# Patient Record
Sex: Male | Born: 1960
Health system: Southern US, Community
[De-identification: ages and names within clinical notes are randomized; demographics above are authoritative.]

## PROBLEM LIST (undated history)

## (undated) DIAGNOSIS — F1910 Other psychoactive substance abuse, uncomplicated: Secondary | ICD-10-CM

## (undated) DIAGNOSIS — F329 Major depressive disorder, single episode, unspecified: Secondary | ICD-10-CM

## (undated) DIAGNOSIS — G4730 Sleep apnea, unspecified: Secondary | ICD-10-CM

## (undated) DIAGNOSIS — G4733 Obstructive sleep apnea (adult) (pediatric): Secondary | ICD-10-CM

## (undated) DIAGNOSIS — F32A Depression, unspecified: Secondary | ICD-10-CM

## (undated) DIAGNOSIS — I10 Essential (primary) hypertension: Secondary | ICD-10-CM

## (undated) HISTORY — DX: Major depressive disorder, single episode, unspecified: F32.9

## (undated) HISTORY — DX: Other psychoactive substance abuse, uncomplicated: F19.10

## (undated) HISTORY — PX: POLYPECTOMY: SHX149

## (undated) HISTORY — PX: JOINT REPLACEMENT: SHX530

## (undated) HISTORY — DX: Sleep apnea, unspecified: G47.30

## (undated) HISTORY — DX: Depression, unspecified: F32.A

## (undated) HISTORY — PX: ROTATOR CUFF REPAIR: SHX139

## (undated) HISTORY — PX: COLONOSCOPY: SHX174

## (undated) HISTORY — DX: Obstructive sleep apnea (adult) (pediatric): G47.33

---

## 1998-11-02 ENCOUNTER — Encounter: Payer: Self-pay | Admitting: Emergency Medicine

## 1998-11-02 ENCOUNTER — Emergency Department (HOSPITAL_COMMUNITY): Admission: EM | Admit: 1998-11-02 | Discharge: 1998-11-02 | Payer: Self-pay | Admitting: Emergency Medicine

## 2001-11-22 ENCOUNTER — Emergency Department (HOSPITAL_COMMUNITY): Admission: EM | Admit: 2001-11-22 | Discharge: 2001-11-22 | Payer: Self-pay | Admitting: Emergency Medicine

## 2003-04-21 ENCOUNTER — Ambulatory Visit (HOSPITAL_BASED_OUTPATIENT_CLINIC_OR_DEPARTMENT_OTHER): Admission: RE | Admit: 2003-04-21 | Discharge: 2003-04-21 | Payer: Self-pay | Admitting: Orthopaedic Surgery

## 2003-04-21 ENCOUNTER — Ambulatory Visit (HOSPITAL_COMMUNITY): Admission: RE | Admit: 2003-04-21 | Discharge: 2003-04-21 | Payer: Self-pay | Admitting: Orthopaedic Surgery

## 2003-04-21 ENCOUNTER — Encounter (INDEPENDENT_AMBULATORY_CARE_PROVIDER_SITE_OTHER): Payer: Self-pay | Admitting: *Deleted

## 2003-06-06 HISTORY — PX: HAND SURGERY: SHX662

## 2006-06-21 ENCOUNTER — Other Ambulatory Visit: Payer: Self-pay

## 2006-06-21 ENCOUNTER — Emergency Department: Payer: Self-pay | Admitting: Emergency Medicine

## 2007-08-30 ENCOUNTER — Encounter: Admission: RE | Admit: 2007-08-30 | Discharge: 2007-08-30 | Payer: Self-pay | Admitting: Internal Medicine

## 2008-03-05 ENCOUNTER — Ambulatory Visit: Payer: Self-pay | Admitting: Pulmonary Disease

## 2008-03-05 DIAGNOSIS — G4710 Hypersomnia, unspecified: Secondary | ICD-10-CM

## 2008-03-05 DIAGNOSIS — I10 Essential (primary) hypertension: Secondary | ICD-10-CM | POA: Insufficient documentation

## 2008-03-26 ENCOUNTER — Ambulatory Visit (HOSPITAL_BASED_OUTPATIENT_CLINIC_OR_DEPARTMENT_OTHER): Admission: RE | Admit: 2008-03-26 | Discharge: 2008-03-26 | Payer: Self-pay | Admitting: Pulmonary Disease

## 2008-03-26 ENCOUNTER — Encounter: Payer: Self-pay | Admitting: Pulmonary Disease

## 2008-04-03 ENCOUNTER — Ambulatory Visit: Payer: Self-pay | Admitting: Pulmonary Disease

## 2008-04-07 ENCOUNTER — Encounter: Payer: Self-pay | Admitting: Pulmonary Disease

## 2008-04-08 ENCOUNTER — Telehealth (INDEPENDENT_AMBULATORY_CARE_PROVIDER_SITE_OTHER): Payer: Self-pay | Admitting: *Deleted

## 2008-04-10 ENCOUNTER — Ambulatory Visit: Payer: Self-pay | Admitting: Pulmonary Disease

## 2008-04-10 DIAGNOSIS — G4733 Obstructive sleep apnea (adult) (pediatric): Secondary | ICD-10-CM

## 2008-04-10 DIAGNOSIS — I499 Cardiac arrhythmia, unspecified: Secondary | ICD-10-CM | POA: Insufficient documentation

## 2008-04-10 DIAGNOSIS — Z8669 Personal history of other diseases of the nervous system and sense organs: Secondary | ICD-10-CM | POA: Insufficient documentation

## 2010-10-18 NOTE — Procedures (Signed)
NAMECRISTOFER, Stuart Bruce NO.:  [id number]   MEDICAL RECORD NO.:  [id number]          PATIENT TYPE:  OUT   LOCATION:  SLEEP CENTER                 FACILITY:  [facility]   PHYSICIAN:  Barbaraann Share, MD,FCCPDATE OF BIRTH:  08/29/1962   DATE OF STUDY:  04/07/2008                            NOCTURNAL POLYSOMNOGRAM   REFERRING PHYSICIAN:   LOCATION:  Sleep Lab.   REFERRING PHYSICIAN:  Barbaraann Share, MD, FCCP.   DATE OF THE STUDY:  March 26, 2008.   INDICATION FOR THE STUDY:  Hypersomnia with sleep apnea.   EPWORTH SCORE:  8.   SLEEP ARCHITECTURE:  The patient had a total sleep time of 244 minutes  with no slow-wave sleep and only 19 minutes of REM.  Sleep-onset latency  was normal at 18 minutes, and REM onset was normal at 83 minutes.  Sleep  efficiency was very poor at 61%.   RESPIRATORY DATA:  The patient was found to have 43 apneas and 29  obstructive hypopneas for an apnea-hypopnea index of 18 events per hour.  He was also noted to have 42 respiratory effort related arousals, giving  him a RDI of 28 events per hour.  The events were not positional, but  there was loud to very loud snoring noted throughout.   OXYGEN DATA:  There was O2 desaturation as low as 88% with the patient's  obstructive events.   CARDIAC DATA:  Frequent PVCs were noted throughout.   MOVEMENT/PARASOMNIA:  No significant leg jerks or abnormal behaviors  were seen.   IMPRESSION/RECOMMENDATION:  1. Moderate obstructive sleep apnea/hypopnea syndrome with an apnea-      hypopnea index of 18 events per hour, and a respiratory disturbance      index of 28 events per hour.  There was O2 desaturation as low as      88%.  Treatment for this degree of sleep apnea can include weight      loss alone if applicable, upper airway surgery,      oral appliance, and also CPAP.  Clinical correlation is suggested.  2. Frequent premature ventricular contractions were noted throughout.      Barbaraann Share, MD,FCCP  Diplomate, American Board of Sleep  Medicine  Electronically Signed     KMC/MEDQ  D:  04/07/2008 16:14:50  T:  04/08/2008 16:10:96  Job:  [id number]

## 2010-10-21 NOTE — Op Note (Signed)
NAME:  Stuart Bruce, Stuart Bruce                             ACCOUNT NO.:  [id number]   MEDICAL RECORD NO.:  [id number]                   PATIENT TYPE:  AMB   LOCATION:  DSC                                  FACILITY:  [facility]   PHYSICIAN:  Lubertha Basque. Jerl Santos, M.D.             DATE OF BIRTH:  1960-11-27   DATE OF PROCEDURE:  04/21/2003  DATE OF DISCHARGE:                                 OPERATIVE REPORT   PREOPERATIVE DIAGNOSES:  Right ring finger mass.   POSTOPERATIVE DIAGNOSES:  Right ring finger mass.   OPERATION PERFORMED:  Excision, right ring finger mass.   SURGEON:  Lubertha Basque. Jerl Santos, M.D.   ASSISTANT:  Prince Rome, P.A.   ANESTHESIA:  Bier block, MAC.   INDICATIONS FOR PROCEDURE:  The patient is a 50 year old man with many  months of a painful concerning mass on the dorsal aspect of his right ring  finger.  He wishes to have this excised.  Informed operative consent was  obtained after discussion of possible complications of reaction to  anesthesia, neurovascular injury and recurrence.   DESCRIPTION OF PROCEDURE:  The patient was taken to the operating suite  where Bier block was applied to the level of the forearm.  Some sedation was  also given.  The patient was positioned supine and prepped and draped in the  normal sterile fashion.  After administration of intravenous antibiotics, a  small longitudinal incision was made on the dorsal aspect of his right ring  finger.  Dissection was carried down to a vascular mass which measured about  1 cm in diameter.  This appeared to have a vascular connection proximal and  distal.  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  Both ends  were clamped and a cautery was used to coagulate the vascular supply in both  directions.  The small mass was removed.  This was found to be filled with  blood and was decompressed.  This was then placed in Formalin and sent to  pathology.  The wound was then irrigated followed by  reapproximation of the  skin with vertical mattresses of nylon.  Tourniquet was deflated and the  fingertip became pink and warm immediately.  Adaptic was applied to the  wound followed by dry gauze and a loose Coban wrap.  Estimated blood loss  and intraoperative fluids as well as accurate tourniquet time can be  obtained from anesthesia records.   DISPOSITION:  The patient was taken to the recovery room in stable  condition.  Plans were for the patient to go home the same day and to follow  up in the office in less than a week.  I will contact him by phone tonight.  Lubertha Basque Jerl Santos, M.D.    PGD/MEDQ  D:  04/21/2003  T:  04/21/2003  Job:  [id number]

## 2014-12-10 ENCOUNTER — Ambulatory Visit
Admission: EM | Admit: 2014-12-10 | Discharge: 2014-12-10 | Disposition: A | Discharge status: Home / Self Care | Payer: 59 | Attending: Internal Medicine | Admitting: Internal Medicine

## 2014-12-10 DIAGNOSIS — H109 Unspecified conjunctivitis: Secondary | ICD-10-CM | POA: Diagnosis not present

## 2014-12-10 HISTORY — DX: Essential (primary) hypertension: I10

## 2014-12-10 MED ORDER — KETOTIFEN FUMARATE 0.025 % OP SOLN: 1.0000 [drp] | Freq: Two times a day (BID) | OPHTHALMIC | Status: DC

## 2014-12-10 MED ORDER — ERYTHROMYCIN 2 % EX OINT: TOPICAL_OINTMENT | CUTANEOUS | Status: DC

## 2014-12-10 NOTE — ED Notes (Signed)
Pt states "I woke up today with my eyes crusted together, my left more than right."

## 2014-12-10 NOTE — Discharge Instructions (Signed)
Prescriptions for ketotifen eye drop (anti inflammatory) and erythromycin eye ointment sent to [facility] on [address].  Have eye rechecked if not starting to improve in a few days; anticipate slow improvement over the next week or two.  Conjunctivitis Conjunctivitis is commonly called "pink eye." Conjunctivitis can be caused by bacterial or viral infection, allergies, or injuries. There is usually redness of the lining of the eye, itching, discomfort, and sometimes discharge. There may be deposits of matter along the eyelids. A viral infection usually causes a watery discharge, while a bacterial infection causes a yellowish, thick discharge. Pink eye is very contagious and spreads by direct contact. You may be given antibiotic eyedrops as part of your treatment. Before using your eye medicine, remove all drainage from the eye by washing gently with warm water and cotton balls. Continue to use the medication until you have awakened 2 mornings in a row without discharge from the eye. Do not rub your eye. This increases the irritation and helps spread infection. Use separate towels from other household members. Wash your hands with soap and water before and after touching your eyes. Use cold compresses to reduce pain and sunglasses to relieve irritation from light. Do not wear contact lenses or wear eye makeup until the infection is gone. SEEK MEDICAL CARE IF:   Your symptoms are not better after 3 days of treatment.  You have increased pain or trouble seeing.  The outer eyelids become very red or swollen. Document Released: 06/29/2004 Document Revised: 08/14/2011 Document Reviewed: 05/22/2005 [facility] Patient Information 2015 [place], [place]. This information is not intended to replace advice given to you by your health care provider. Make sure you discuss any questions you have with your health care provider.

## 2014-12-10 NOTE — ED Provider Notes (Signed)
CSN: [id number]     Arrival date & time 12/10/14  1711 History   First MD Initiated Contact with Patient 12/10/14 1738     Chief Complaint  Patient presents with  . Eye Pain   HPI Patient is a 54 year old gentleman who presents with onset of left eye discomfort yesterday evening, particularly the left lower lid laterally feels sore. The eye lids were crusty this a.m., And eyes are little red. No runny or congested nose, no sore throat, not coughing. No injury to the left eye. He does not wear contacts.  Past Medical History  Diagnosis Date  . Hypertension    History reviewed. No pertinent past surgical history. History reviewed. No pertinent family history. History  Substance Use Topics  . Smoking status: Never Smoker   . Smokeless tobacco: Not on file  . Alcohol Use: Yes     Comment: social    Review of Systems  All other systems reviewed and are negative.   Allergies  Review of patient's allergies indicates no known allergies.  Home Medications   Prior to Admission medications   Medication Sig Start Date End Date Taking? Authorizing Provider  buPROPion (WELLBUTRIN SR) 100 MG 12 hr tablet Take 100 mg by mouth 2 (two) times daily.   Yes Historical Provider, MD  doxazosin (CARDURA) 8 MG tablet Take 8 mg by mouth daily.   Yes Historical Provider, MD  fosinopril (MONOPRIL) 10 MG tablet Take 10 mg by mouth daily.   Yes Historical Provider, MD   BP 140/100 mmHg  Pulse 69  Temp(Src) 98.3 F (36.8 C) (Tympanic)  Resp 16  Ht 5\' 7"  (1.702 m)  Wt 215 lb (97.523 kg)  BMI 33.67 kg/m2  SpO2 99% Physical Exam  Constitutional: He is oriented to person, place, and time. No distress.  Alert, nicely groomed  HENT:  Head: Atraumatic.  Eyes:  Conjugate gaze. Left eye is moderately injected, with thickening and puffiness, redness, of the upper and lower lids. This is most pronounced at the lateral aspect of the lower lid. Not actually pointing. No corneal irregularity is detected on  penlight exam. The upper lid is everted, no foreign body is seen. No blepharospasm. No photophobia  Neck: Neck supple.  Cardiovascular: Normal rate.   Pulmonary/Chest: No respiratory distress.  Abdominal: Soft. He exhibits no distension.  Musculoskeletal: Normal range of motion.  Neurological: He is alert and oriented to person, place, and time.  Skin: Skin is warm and dry.  No cyanosis  Nursing note and vitals reviewed.    Visual Acuity  Right Eye Distance: 20/30 Left Eye Distance: 20/25 Bilateral Distance: 20/25     ED Course  Procedures   MDM   1. Conjunctivitis of left eye    differential diagnosis includes allergic, irritant, and infectious sources (viral versus bacterial) Ketotifen eyedrops, and erythromycin eye ointment sent to the pharmacy. Anticipate gradual improvement over the next several days. Recheck or follow up with the eye doctor if this does not occur.    Sherlene Shams, MD 12/10/14 [phone]

## 2014-12-18 ENCOUNTER — Encounter: Payer: Self-pay | Admitting: Nurse Practitioner

## 2014-12-18 ENCOUNTER — Ambulatory Visit (INDEPENDENT_AMBULATORY_CARE_PROVIDER_SITE_OTHER): Payer: 59 | Admitting: Nurse Practitioner

## 2014-12-18 VITALS — BP 142/98 | HR 77 | Temp 97.7°F | Resp 18 | Ht 67.0 in | Wt 220.2 lb

## 2014-12-18 DIAGNOSIS — Z7189 Other specified counseling: Secondary | ICD-10-CM | POA: Diagnosis not present

## 2014-12-18 DIAGNOSIS — G4733 Obstructive sleep apnea (adult) (pediatric): Secondary | ICD-10-CM | POA: Diagnosis not present

## 2014-12-18 DIAGNOSIS — I10 Essential (primary) hypertension: Secondary | ICD-10-CM

## 2014-12-18 DIAGNOSIS — Z7689 Persons encountering health services in other specified circumstances: Secondary | ICD-10-CM

## 2014-12-18 MED ORDER — AMLODIPINE BESYLATE 2.5 MG PO TABS: 2.5000 mg | ORAL_TABLET | Freq: Every day | ORAL | Status: DC

## 2014-12-18 NOTE — Assessment & Plan Note (Signed)
Not on anything for treatment currently. Pt had sleep study done years prior at the sleep disorder clinic. Pt reports he improves when losing weight. Discussed diet and exercise.

## 2014-12-18 NOTE — Assessment & Plan Note (Signed)
Discussed acute and chronic issues. Reviewed health maintenance measures, PFSHx, and immunizations. Obtain records from previous facility.   

## 2014-12-18 NOTE — Patient Instructions (Signed)
Follow up in 1 month for HTN. Bring logs from home or average if you can.   Welcome to [facility]!

## 2014-12-18 NOTE — Progress Notes (Signed)
Pre visit review using our clinic review tool, if applicable. No additional management support is needed unless otherwise documented below in the visit note. 

## 2014-12-18 NOTE — Progress Notes (Signed)
Subjective:    Patient ID: Stuart Bruce, male    DOB: Oct 31, 1960, 54 y.o.   MRN: [id number]  HPI  Stuart Bruce is a 54 yo male establishing care and CC of HTN issues.   1) New pt info:   Immunizations- tdap 2/12  Colonoscopy- None so far, will discuss  Eye exam- 2000  2) Chronic Problems-  Depression- Wellbutrin, would like to wean off   HTN- Cadura and Monopril, been on these for 15-20 years he reports. See Acute.  OSA- No treatment, had sleep study in past, still snoring, props up and finds it helps. Wife denies apneic events.   3) Acute Problems-  HTN- BP is not controlled with both medications. Dropped 35 lbs in past and BP was better. Wants to work on diet and exercise, does not use extra salt. Checks at home. This morning 138/89.   Review of Systems  Constitutional: Negative for fever, chills, diaphoresis and fatigue.  Eyes: Negative for visual disturbance.  Respiratory: Negative for chest tightness, shortness of breath and wheezing.   Cardiovascular: Negative for chest pain, palpitations and leg swelling.  Neurological: Negative for dizziness, weakness and numbness.  Psychiatric/Behavioral: The patient is not nervous/anxious.    Past Medical History  Diagnosis Date  . Hypertension   . Depression   . OSA (obstructive sleep apnea)     History   Social History  . Marital Status: Married    Spouse Name: N/A  . Number of Children: N/A  . Years of Education: N/A   Occupational History  . Not on file.   Social History Main Topics  . Smoking status: Never Smoker   . Smokeless tobacco: Never Used  . Alcohol Use: 0.0 oz/week    0 Standard drinks or equivalent per week     Comment: social  . Drug Use: No  . Sexual Activity:    Partners: Female     Comment: Wife- post-menopausal    Other Topics Concern  . Not on file   Social History Narrative   Works for [employer] in [employer] with wife and two daughters   Pets: 1 dog lives inside    Caffeine- 2 cups of  coffee, no soda/tea    Enjoys playing golf, reading    Past Surgical History  Procedure Laterality Date  . Hand surgery Right 2005    Cyst on ring finger    Family History  Problem Relation Age of Onset  . Cancer Mother     Breast  . Diabetes Father   . Cancer Father     Renal  . Drug abuse Paternal Aunt     No Known Allergies  Current Outpatient Prescriptions on File Prior to Visit  Medication Sig Dispense Refill  . buPROPion (WELLBUTRIN SR) 100 MG 12 hr tablet Take 150 mg by mouth daily.     Marland Kitchen doxazosin (CARDURA) 8 MG tablet Take 8 mg by mouth daily.     No current facility-administered medications on file prior to visit.      Objective:   Physical Exam  Constitutional: He is oriented to person, place, and time. He appears well-developed and well-nourished. No distress.  BP 142/98 mmHg  Pulse 77  Temp(Src) 97.7 F (36.5 C)  Resp 18  Ht 5\' 7"  (1.702 m)  Wt 220 lb 3.2 oz (99.882 kg)  BMI 34.48 kg/m2  SpO2 95%   HENT:  Head: Normocephalic and atraumatic.  Right Ear: External ear normal.  Left  Ear: External ear normal.  Cardiovascular: Normal rate, regular rhythm, normal heart sounds and intact distal pulses.  Exam reveals no gallop and no friction rub.   No murmur heard. Pulmonary/Chest: Effort normal and breath sounds normal. No respiratory distress. He has no wheezes. He has no rales. He exhibits no tenderness.  Abdominal:  Obese  Neurological: He is alert and oriented to person, place, and time.  Skin: Skin is warm and dry. No rash noted. He is not diaphoretic.  Psychiatric: He has a normal mood and affect. His behavior is normal. Judgment and thought content normal.      Assessment & Plan:

## 2014-12-18 NOTE — Assessment & Plan Note (Signed)
BP Readings from Last 3 Encounters:  12/18/14 142/98  12/10/14 140/100  04/10/08 138/84   Added Norvasc 2.5 cm. Continue other medications.

## 2015-01-15 ENCOUNTER — Encounter: Payer: Self-pay | Admitting: Nurse Practitioner

## 2015-01-15 ENCOUNTER — Ambulatory Visit (INDEPENDENT_AMBULATORY_CARE_PROVIDER_SITE_OTHER): Payer: 59 | Admitting: Nurse Practitioner

## 2015-01-15 VITALS — BP 138/82 | HR 82 | Temp 98.3°F | Resp 18 | Ht 67.0 in | Wt 209.4 lb

## 2015-01-15 DIAGNOSIS — I10 Essential (primary) hypertension: Secondary | ICD-10-CM

## 2015-01-15 NOTE — Assessment & Plan Note (Signed)
Pt is down 11 lbs and BP down approx 10 points.   FU in 6 months.   BP Readings from Last 3 Encounters:  01/15/15 138/82  12/18/14 142/98  12/10/14 140/100   Wt Readings from Last 3 Encounters:  01/15/15 209 lb 6.4 oz (94.983 kg)  12/18/14 220 lb 3.2 oz (99.882 kg)  12/10/14 215 lb (97.523 kg)   No changes to medication regimen today.

## 2015-01-15 NOTE — Progress Notes (Signed)
Patient ID: Stuart Bruce, male    DOB: [birth year]  Age: 54 y.o. MRN: [id number]  CC: Follow-up   HPI Stuart Bruce presents for follow up of HTN.   1) Added 2.5 mg of norvasc at last visit. Doing well, no complaints.   Increased exercise and stuck to low glycemic diet handout given at last visit.   History Stuart Bruce has a past medical history of Hypertension; Depression; and OSA (obstructive sleep apnea).   He has past surgical history that includes Hand surgery (Right, 2005).   His family history includes Cancer in his father and mother; Diabetes in his father; Drug abuse in his paternal aunt.He reports that he has never smoked. He has never used smokeless tobacco. He reports that he drinks alcohol. He reports that he does not use illicit drugs.  Outpatient Prescriptions Prior to Visit  Medication Sig Dispense Refill  . amLODipine (NORVASC) 2.5 MG tablet Take 1 tablet (2.5 mg total) by mouth daily. 90 tablet 3  . buPROPion (WELLBUTRIN SR) 100 MG 12 hr tablet Take 150 mg by mouth daily.     Marland Kitchen doxazosin (CARDURA) 8 MG tablet Take 8 mg by mouth daily.    . fosinopril (MONOPRIL) 10 MG tablet Take 10 mg by mouth daily.     No facility-administered medications prior to visit.    ROS Review of Systems  Constitutional: Negative for fever, chills, diaphoresis and fatigue.  Eyes: Negative for visual disturbance.  Respiratory: Negative for chest tightness, shortness of breath and wheezing.   Cardiovascular: Negative for chest pain, palpitations and leg swelling.  Neurological: Negative for dizziness, weakness and numbness.  Psychiatric/Behavioral: The patient is not nervous/anxious.     Objective:  BP 138/82 mmHg  Pulse 82  Temp(Src) 98.3 F (36.8 C)  Resp 18  Ht 5\' 7"  (1.702 m)  Wt 209 lb 6.4 oz (94.983 kg)  BMI 32.79 kg/m2  SpO2 97%  Physical Exam  Constitutional: He is oriented to person, place, and time. He appears well-developed and well-nourished. No distress.  HENT:  Head:  Normocephalic and atraumatic.  Right Ear: External ear normal.  Left Ear: External ear normal.  Eyes: Right eye exhibits no discharge. Left eye exhibits no discharge. No scleral icterus.  Cardiovascular: Normal rate, regular rhythm and normal heart sounds.  Exam reveals no gallop and no friction rub.   No murmur heard. Pulmonary/Chest: Effort normal and breath sounds normal. No respiratory distress. He has no wheezes. He has no rales. He exhibits no tenderness.  Neurological: He is alert and oriented to person, place, and time.  Skin: Skin is warm and dry. No rash noted. He is not diaphoretic.  Psychiatric: He has a normal mood and affect. His behavior is normal. Judgment and thought content normal.   Assessment & Plan:   Stuart Bruce was seen today for follow-up.  Diagnoses and all orders for this visit:  Essential hypertension   I am having Stuart Bruce maintain his doxazosin, buPROPion, fosinopril, and amLODipine.  No orders of the defined types were placed in this encounter.     Follow-up: Return in about 6 months (around 07/18/2015) for HTN and Weight loss.

## 2015-01-15 NOTE — Progress Notes (Signed)
Pre visit review using our clinic review tool, if applicable. No additional management support is needed unless otherwise documented below in the visit note. 

## 2015-01-15 NOTE — Patient Instructions (Signed)
Congratulations on your progress!!   Keep up the good work and see you in 6 mos.

## 2015-05-19 ENCOUNTER — Ambulatory Visit
Admission: RE | Admit: 2015-05-19 | Discharge: 2015-05-19 | Disposition: A | Discharge status: Home / Self Care | Payer: 59 | Source: Ambulatory Visit | Attending: Family Medicine | Admitting: Family Medicine

## 2015-05-19 ENCOUNTER — Ambulatory Visit (INDEPENDENT_AMBULATORY_CARE_PROVIDER_SITE_OTHER): Payer: 59 | Admitting: Family Medicine

## 2015-05-19 ENCOUNTER — Encounter: Payer: Self-pay | Admitting: Family Medicine

## 2015-05-19 ENCOUNTER — Telehealth: Payer: Self-pay | Admitting: Family Medicine

## 2015-05-19 VITALS — BP 110/72 | HR 73 | Temp 98.3°F | Ht 67.0 in

## 2015-05-19 DIAGNOSIS — M79605 Pain in left leg: Secondary | ICD-10-CM | POA: Diagnosis not present

## 2015-05-19 DIAGNOSIS — I83892 Varicose veins of left lower extremities with other complications: Secondary | ICD-10-CM | POA: Insufficient documentation

## 2015-05-19 NOTE — Telephone Encounter (Signed)
Called patient and informed him that the ultrasound did not reveal a DVT. It did show superficial thrombophlebitis. Advised treatment of this is cold compresses, anti-inflammatories, and elevation of his leg. He asked if he could use aspirin as his anti-inflammatory. I advised he could. Advised that if his leg were to increase in size, or if he were to develop any chest pain or shortness of breath he should seek medical attention. He voiced understanding.

## 2015-05-19 NOTE — Progress Notes (Signed)
Pre visit review using our clinic review tool, if applicable. No additional management support is needed unless otherwise documented below in the visit note. 

## 2015-05-19 NOTE — Assessment & Plan Note (Signed)
Patient with new onset lower extremity pain associated with new varicose veins in his left lower extremity. Concern would be for DVT. He has no risk factors. His calves are equal in size. These symptoms would argue against this, though cannot rule out without an ultrasound. We will obtain lower extremity ultrasound to rule out DVT. If no DVT could be superficial thrombophlebitis versus varicose veins. Patient is given return precautions.

## 2015-05-19 NOTE — Patient Instructions (Signed)
Nice to meet you. Please go get the ultrasound of your leg. If you develop any chest pain, shortness of breath or swelling in one leg, increased pain, numbness, weakness, or any new or change in symptoms please seek medical attention.

## 2015-05-19 NOTE — Progress Notes (Signed)
Patient ID: Stuart Bruce, male   DOB: [birth year], 54 y.o.   MRN: [id number]  Stuart Rumps, MD Phone: [phone]  Stuart Bruce is a 54 y.o. male who presents today for same-day visit.  Left leg pain: Patient notes he woke up this morning and his left lateral calf hurt. He noted new varicose veins in the anterior portion of his leg as well with this. Describes the pain as a burning sensation right beneath the skin. He has no overlying erythema. States he has a history of this kind of pain in the past that usually resolves quickly, though is not typically associated with varicose veins. No injury to the leg. No recent surgeries. No recent long trips. Typically walks every hour at work. No history of DVT. Does have a history of varicose veins in the posterior portion of his calf. No chest pain or shortness of breath.  PMH: nonsmoker.   ROS see history of present illness  Objective  Physical Exam Filed Vitals:   05/19/15 1558  BP: 110/72  Pulse: 73  Temp: 98.3 F (36.8 C)    Physical Exam  Constitutional: He is well-developed, well-nourished, and in no distress.  HENT:  Head: Normocephalic and atraumatic.  Musculoskeletal:  Left lateral calf in the mid proximal portion with a small area of tenderness with underlying likely varicose vein, there is no erythema or fluctuance in this area, there are varicose veins on the anterior portion of his tibia at the same level, bilateral calves are 38 cm, there is no posterior calf tenderness, 2+ DP pulses bilaterally, 5 out of 5 strength in plantar flexion and dorsiflexion inversion and eversion, sensation to light touch intact in bilateral lower extremities, feet are warm and well-perfused  Skin: Skin is warm and dry. He is not diaphoretic.     Assessment/Plan: Please see individual problem list.  Lower extremity pain, left Patient with new onset lower extremity pain associated with new varicose veins in his left lower extremity. Concern  would be for DVT. He has no risk factors. His calves are equal in size. These symptoms would argue against this, though cannot rule out without an ultrasound. We will obtain lower extremity ultrasound to rule out DVT. If no DVT could be superficial thrombophlebitis versus varicose veins. Patient is given return precautions.    Orders Placed This Encounter  Procedures  . US Venous Img Lower Unilateral Left    Standing Status: Future     Number of Occurrences: 1     Standing Expiration Date: 07/19/2016    Order Specific Question:  Reason for Exam (SYMPTOM  OR DIAGNOSIS REQUIRED)    Answer:  left calf pain, new vericose veins    Order Specific Question:  Preferred imaging location?    Answer:  [facility]    Order Specific Question:  Call Results- Best Contact Number?    Answer:  cell [phone] hold patient    Dragon voice recognition software was used during the dictation process of this note. If any phrases or words seem inappropriate it is likely secondary to the translation process being inefficient.  Stuart Bruce

## 2015-07-23 ENCOUNTER — Ambulatory Visit: Payer: 59 | Admitting: Nurse Practitioner

## 2015-07-26 ENCOUNTER — Encounter: Payer: Self-pay | Admitting: Nurse Practitioner

## 2015-07-26 ENCOUNTER — Ambulatory Visit (INDEPENDENT_AMBULATORY_CARE_PROVIDER_SITE_OTHER): Payer: 59 | Admitting: Nurse Practitioner

## 2015-07-26 VITALS — BP 120/80 | HR 81 | Temp 98.2°F | Resp 12 | Ht 67.0 in | Wt 192.2 lb

## 2015-07-26 DIAGNOSIS — F329 Major depressive disorder, single episode, unspecified: Secondary | ICD-10-CM | POA: Diagnosis not present

## 2015-07-26 DIAGNOSIS — R634 Abnormal weight loss: Secondary | ICD-10-CM | POA: Diagnosis not present

## 2015-07-26 DIAGNOSIS — I10 Essential (primary) hypertension: Secondary | ICD-10-CM | POA: Insufficient documentation

## 2015-07-26 DIAGNOSIS — F32A Depression, unspecified: Secondary | ICD-10-CM | POA: Insufficient documentation

## 2015-07-26 LAB — CBC WITH DIFFERENTIAL/PLATELET
Basophils Absolute: 0 10*3/uL (ref 0.0–0.1)
Basophils Relative: 0.6 % (ref 0.0–3.0)
EOS ABS: 0.4 10*3/uL (ref 0.0–0.7)
Eosinophils Relative: 8.3 % — ABNORMAL HIGH (ref 0.0–5.0)
HCT: 41.5 % (ref 39.0–52.0)
Hemoglobin: 14 g/dL (ref 13.0–17.0)
LYMPHS ABS: 1.3 10*3/uL (ref 0.7–4.0)
Lymphocytes Relative: 24.8 % (ref 12.0–46.0)
MCHC: 33.8 g/dL (ref 30.0–36.0)
MCV: 97.4 fl (ref 78.0–100.0)
Monocytes Absolute: 0.5 10*3/uL (ref 0.1–1.0)
Monocytes Relative: 9.7 % (ref 3.0–12.0)
NEUTROS ABS: 3 10*3/uL (ref 1.4–7.7)
Neutrophils Relative %: 56.6 % (ref 43.0–77.0)
Platelets: 200 10*3/uL (ref 150.0–400.0)
RBC: 4.26 Mil/uL (ref 4.22–5.81)
RDW: 14.2 % (ref 11.5–15.5)
WBC: 5.4 10*3/uL (ref 4.0–10.5)

## 2015-07-26 LAB — COMPREHENSIVE METABOLIC PANEL
ALT: 105 U/L — AB (ref 0–53)
AST: 67 U/L — ABNORMAL HIGH (ref 0–37)
Albumin: 4.3 g/dL (ref 3.5–5.2)
Alkaline Phosphatase: 28 U/L — ABNORMAL LOW (ref 39–117)
BUN: 16 mg/dL (ref 6–23)
CO2: 25 meq/L (ref 19–32)
CREATININE: 0.72 mg/dL (ref 0.40–1.50)
Calcium: 9.6 mg/dL (ref 8.4–10.5)
Chloride: 101 mEq/L (ref 96–112)
GFR: 121.72 mL/min (ref >60.00)
GLUCOSE: 84 mg/dL (ref 70–99)
Potassium: 4.1 mEq/L (ref 3.5–5.1)
SODIUM: 140 meq/L (ref 135–145)
Total Bilirubin: 0.5 mg/dL (ref 0.2–1.2)
Total Protein: 6.9 g/dL (ref 6.0–8.3)

## 2015-07-26 NOTE — Progress Notes (Signed)
Patient ID: Stuart Bruce, male    DOB: [birth year]  Age: 55 y.o. MRN: [id number]  CC: Follow-up   HPI Stuart Bruce presents for follow up of HTN, WT loss, and Wellbutrin.   1) HTN- wt loss has helped bring this down Flutuating at home, but always under 140/90   2) Pt has lost 17 lbs since August.  All diet, not exercising formally   3) Wellbutrin- Stopped taking Felt fine and stopped taking it  Ran out and never got a refill    History Stuart Bruce has a past medical history of Hypertension; Depression; and OSA (obstructive sleep apnea).   Stuart Bruce has past surgical history that includes Hand surgery (Right, 2005).   His family history includes Cancer in his father and mother; Diabetes in his father; Drug abuse in his paternal aunt.Stuart Bruce reports that Stuart Bruce has never smoked. Stuart Bruce has never used smokeless tobacco. Stuart Bruce reports that Stuart Bruce drinks alcohol. Stuart Bruce reports that Stuart Bruce does not use illicit drugs.  Outpatient Prescriptions Prior to Visit  Medication Sig Dispense Refill  . amLODipine (NORVASC) 2.5 MG tablet Take 1 tablet (2.5 mg total) by mouth daily. 90 tablet 3  . doxazosin (CARDURA) 8 MG tablet Take 8 mg by mouth daily.    . fosinopril (MONOPRIL) 10 MG tablet Take 10 mg by mouth daily.    Marland Kitchen buPROPion (WELLBUTRIN SR) 100 MG 12 hr tablet Take 150 mg by mouth daily. Reported on 07/26/2015     No facility-administered medications prior to visit.    ROS Review of Systems  Constitutional: Negative for fever, chills, diaphoresis and fatigue.  Eyes: Negative for visual disturbance.  Respiratory: Negative for chest tightness, shortness of breath and wheezing.   Cardiovascular: Negative for chest pain, palpitations and leg swelling.  Neurological: Negative for dizziness, weakness and numbness.  Psychiatric/Behavioral: The patient is not nervous/anxious.    Objective:  BP 120/80 mmHg  Pulse 81  Temp(Src) 98.2 F (36.8 C) (Oral)  Resp 12  Ht 5\' 7"  (1.702 m)  Wt 192 lb 3.2 oz (87.181 kg)  BMI 30.10 kg/m2   SpO2 95%  Physical Exam  Constitutional: Stuart Bruce is oriented to person, place, and time. Stuart Bruce appears well-developed and well-nourished. No distress.  HENT:  Head: Normocephalic and atraumatic.  Right Ear: External ear normal.  Left Ear: External ear normal.  Cardiovascular: Normal rate, regular rhythm and normal heart sounds.  Exam reveals no gallop and no friction rub.   No murmur heard. Pulmonary/Chest: Effort normal and breath sounds normal. No respiratory distress. Stuart Bruce has no wheezes. Stuart Bruce has no rales. Stuart Bruce exhibits no tenderness.  Neurological: Stuart Bruce is alert and oriented to person, place, and time.  Skin: Skin is warm and dry. No rash noted. Stuart Bruce is not diaphoretic.  Psychiatric: Stuart Bruce has a normal mood and affect. His behavior is normal. Judgment and thought content normal.   Assessment & Plan:   Stuart Bruce was seen today for follow-up.  Diagnoses and all orders for this visit:  Benign essential HTN -     Comprehensive metabolic panel -     CBC with Differential/Platelet  Loss of weight  Depression   I have discontinued Stuart Bruce's buPROPion. I am also having him maintain his doxazosin, fosinopril, and amLODipine.  No orders of the defined types were placed in this encounter.     Follow-up: Return in about 6 months (around 01/23/2016) for Follow up .

## 2015-07-26 NOTE — Patient Instructions (Signed)
Keep up the good work.  See you in 6 months. 

## 2015-07-26 NOTE — Assessment & Plan Note (Signed)
Stable. Pt has lost 17 lbs since August and 30 lbs overall. He would like to lose 20 more and is motivated.   Wt Readings from Last 3 Encounters:  07/26/15 192 lb 3.2 oz (87.181 kg)  01/15/15 209 lb 6.4 oz (94.983 kg)  12/18/14 220 lb 3.2 oz (99.882 kg)

## 2015-07-26 NOTE — Assessment & Plan Note (Signed)
Stopped Wellbutrin on his own.  Okay to do so Feels fine he reports

## 2015-07-26 NOTE — Assessment & Plan Note (Signed)
BP Readings from Last 3 Encounters:  07/26/15 120/80  05/19/15 110/72  01/15/15 138/82   Stable. Okayed for him to try without Norvasc daily. Wife is a Marine scientist and he checks at home. Knows to start back if 140/90 or higher. Will follow up in 6 months and will check CMET and CBC w. Diff.  Lost 17 lbs. Congratulated him

## 2015-09-13 ENCOUNTER — Other Ambulatory Visit: Payer: Self-pay | Admitting: Nurse Practitioner

## 2015-12-02 ENCOUNTER — Telehealth: Payer: Self-pay | Admitting: Family Medicine

## 2015-12-02 MED ORDER — FOSINOPRIL SODIUM 10 MG PO TABS: 10.0000 mg | ORAL_TABLET | Freq: Every day | ORAL | Status: DC

## 2015-12-02 NOTE — Telephone Encounter (Signed)
Refilled and spoke with patient's wife.

## 2015-12-02 NOTE — Telephone Encounter (Signed)
Pt wife called regarding pt needing a refill for fosinopril (MONOPRIL) 10 MG tablet. Pt is sch to see Dr Caryl Bis on 08/14.   Pharmacy is [facility], [address]  Call pt @ [phone] Thank you!

## 2016-01-12 ENCOUNTER — Other Ambulatory Visit: Payer: Self-pay | Admitting: Nurse Practitioner

## 2016-01-12 NOTE — Telephone Encounter (Signed)
Please determine if he is taking this for his blood pressure or a prostate issue. It is not obvious why he is taking this medication from review of the chart. Thanks.

## 2016-01-12 NOTE — Telephone Encounter (Signed)
Medication refilled 09/13/15. Patient has a future appointment with you on 02/18/16. Please advise?

## 2016-01-13 NOTE — Telephone Encounter (Signed)
Refill sent to pharmacy.   

## 2016-01-13 NOTE — Telephone Encounter (Signed)
[facility]TC[facility]

## 2016-01-13 NOTE — Telephone Encounter (Signed)
Blood pressure is why this pt is taking this medication.

## 2016-01-13 NOTE — Telephone Encounter (Signed)
LVTCB Dr.Sonnenberg wants to know what he is taking the Cardura for? Is it for prostate or blood pressure

## 2016-01-13 NOTE — Telephone Encounter (Signed)
Pt was returning office phone call. He says it's about his refill.

## 2016-01-17 ENCOUNTER — Ambulatory Visit: Payer: 59 | Admitting: Family Medicine

## 2016-01-24 ENCOUNTER — Ambulatory Visit: Payer: 59 | Admitting: Nurse Practitioner

## 2016-02-18 ENCOUNTER — Ambulatory Visit (INDEPENDENT_AMBULATORY_CARE_PROVIDER_SITE_OTHER): Payer: 59 | Admitting: Family Medicine

## 2016-02-18 ENCOUNTER — Encounter: Payer: Self-pay | Admitting: Family Medicine

## 2016-02-18 VITALS — BP 128/86 | HR 86 | Temp 98.4°F | Ht 67.0 in | Wt 193.6 lb

## 2016-02-18 DIAGNOSIS — E669 Obesity, unspecified: Secondary | ICD-10-CM | POA: Diagnosis not present

## 2016-02-18 DIAGNOSIS — I10 Essential (primary) hypertension: Secondary | ICD-10-CM

## 2016-02-18 DIAGNOSIS — R945 Abnormal results of liver function studies: Secondary | ICD-10-CM

## 2016-02-18 DIAGNOSIS — R7989 Other specified abnormal findings of blood chemistry: Secondary | ICD-10-CM

## 2016-02-18 DIAGNOSIS — G4733 Obstructive sleep apnea (adult) (pediatric): Secondary | ICD-10-CM

## 2016-02-18 LAB — COMPREHENSIVE METABOLIC PANEL
ALBUMIN: 4.6 g/dL (ref 3.5–5.2)
ALT: 45 U/L (ref 0–53)
AST: 30 U/L (ref 0–37)
Alkaline Phosphatase: 27 U/L — ABNORMAL LOW (ref 39–117)
BILIRUBIN TOTAL: 0.7 mg/dL (ref 0.2–1.2)
BUN: 14 mg/dL (ref 6–23)
CALCIUM: 9.2 mg/dL (ref 8.4–10.5)
CHLORIDE: 101 meq/L (ref 96–112)
CO2: 29 meq/L (ref 19–32)
CREATININE: 0.84 mg/dL (ref 0.40–1.50)
GFR: 101.66 mL/min (ref >60.00)
Glucose, Bld: 91 mg/dL (ref 70–99)
Potassium: 4.1 mEq/L (ref 3.5–5.1)
SODIUM: 138 meq/L (ref 135–145)
Total Protein: 7.6 g/dL (ref 6.0–8.3)

## 2016-02-18 LAB — LIPID PANEL
CHOL/HDL RATIO: 3
CHOLESTEROL: 204 mg/dL — AB (ref 0–200)
HDL: 75 mg/dL (ref >39.00)
LDL Cholesterol: 113 mg/dL — ABNORMAL HIGH (ref 0–99)
NonHDL: 129.23
TRIGLYCERIDES: 82 mg/dL (ref 0.0–149.0)
VLDL: 16.4 mg/dL (ref 0.0–40.0)

## 2016-02-18 LAB — HEMOGLOBIN A1C: Hgb A1c MFr Bld: 5.4 % (ref 4.6–6.5)

## 2016-02-18 NOTE — Assessment & Plan Note (Signed)
At goal. Continue current medications. Labs per below

## 2016-02-18 NOTE — Assessment & Plan Note (Signed)
Not currently on treatment. Has lost weight and had improvement in symptoms. Asymptomatic at this time. He'll monitor for recurrence of symptoms and if they recur we will repeat a sleep study.

## 2016-02-18 NOTE — Patient Instructions (Signed)
Nice to meet you. Please continue your current blood pressure medications. We will check lab work. We will call you with the results. Please continue to run and exercise. Please decrease your alcohol intake. Do not stop drinking alcohol all at once. Decrease slowly over a period of months. If you develop nausea, vomiting, shakiness, or any new or changing symptoms please seek medical attention.

## 2016-02-18 NOTE — Progress Notes (Signed)
Pre visit review using our clinic review tool, if applicable. No additional management support is needed unless otherwise documented below in the visit note. 

## 2016-02-18 NOTE — Progress Notes (Signed)
  [name], MD Phone: [phone]  Stuart Bruce is a 55 y.o. male who presents today for follow-up.  HYPERTENSION  Disease Monitoring  Home BP Monitoring typically 130s over 80s Chest pain- no    Dyspnea- no Medications  Compliance-  taking fosinopril and Cardura.  Edema- no Has been running for exercise. Try to lose weight.  History of sleep apnea: No recent issues with this. Mild snoring. No apneic episodes. No hypersomnia.  Elevated LFTs: Noted on most recent lab work. Patient never followed up for ultrasound. Occasional right upper quadrant discomfort. Drinks 4-5 alcoholic beverages a night. No Tylenol use.   PMH: nonsmoker.   ROS see history of present illness  Objective  Physical Exam Vitals:   02/18/16 0916  BP: 128/86  Pulse: 86  Temp: 98.4 F (36.9 C)    BP Readings from Last 3 Encounters:  02/18/16 128/86  07/26/15 120/80  05/19/15 110/72   Wt Readings from Last 3 Encounters:  02/18/16 193 lb 9.6 oz (87.8 kg)  07/26/15 192 lb 3.2 oz (87.2 kg)  01/15/15 209 lb 6.4 oz (95 kg)    Physical Exam  Constitutional: No distress.  HENT:  Head: Normocephalic and atraumatic.  Cardiovascular: Normal rate, regular rhythm and normal heart sounds.   Pulmonary/Chest: Effort normal and breath sounds normal.  Abdominal: Soft. Bowel sounds are normal. He exhibits no distension. There is no tenderness. There is no rebound and no guarding.  Musculoskeletal: He exhibits no edema.  Neurological: He is alert. Gait normal.  Skin: Skin is warm and dry. He is not diaphoretic.     Assessment/Plan: Please see individual problem list.  Benign essential HTN At goal. Continue current medications. Labs per below  Obstructive sleep apnea Not currently on treatment. Has lost weight and had improvement in symptoms. Asymptomatic at this time. He'll monitor for recurrence of symptoms and if they recur we will repeat a sleep study.  Elevated LFTs Elevated on prior check. He  does drink a fair amount of alcohol. Counseled on decreasing alcohol use. Advised not to cut out all alcohol at once or decrease suddenly. Decrease slowly over a period of time. Avoid Tylenol. Advised of alcohol withdrawal symptoms. Check LFTs today and likely obtain an ultrasound if still elevated.   Orders Placed This Encounter  Procedures  . Comp Met (CMET)  . Lipid Profile  . HgB A1c    [name], MD [place]

## 2016-02-18 NOTE — Assessment & Plan Note (Signed)
Elevated on prior check. He does drink a fair amount of alcohol. Counseled on decreasing alcohol use. Advised not to cut out all alcohol at once or decrease suddenly. Decrease slowly over a period of time. Avoid Tylenol. Advised of alcohol withdrawal symptoms. Check LFTs today and likely obtain an ultrasound if still elevated.

## 2016-04-25 ENCOUNTER — Other Ambulatory Visit: Payer: Self-pay | Admitting: Family Medicine

## 2016-05-19 ENCOUNTER — Ambulatory Visit: Payer: 59 | Admitting: Family Medicine

## 2016-06-14 DIAGNOSIS — J069 Acute upper respiratory infection, unspecified: Secondary | ICD-10-CM | POA: Diagnosis not present

## 2016-07-26 ENCOUNTER — Encounter: Payer: Self-pay | Admitting: Family Medicine

## 2016-07-26 ENCOUNTER — Ambulatory Visit (INDEPENDENT_AMBULATORY_CARE_PROVIDER_SITE_OTHER): Payer: 59 | Admitting: Family Medicine

## 2016-07-26 VITALS — BP 156/92 | HR 91 | Temp 98.4°F | Wt 202.4 lb

## 2016-07-26 DIAGNOSIS — I10 Essential (primary) hypertension: Secondary | ICD-10-CM | POA: Diagnosis not present

## 2016-07-26 DIAGNOSIS — G4733 Obstructive sleep apnea (adult) (pediatric): Secondary | ICD-10-CM

## 2016-07-26 DIAGNOSIS — K625 Hemorrhage of anus and rectum: Secondary | ICD-10-CM | POA: Diagnosis not present

## 2016-07-26 LAB — COMPREHENSIVE METABOLIC PANEL
ALT: 66 U/L — ABNORMAL HIGH (ref 0–53)
AST: 41 U/L — AB (ref 0–37)
Albumin: 4.8 g/dL (ref 3.5–5.2)
Alkaline Phosphatase: 27 U/L — ABNORMAL LOW (ref 39–117)
BUN: 15 mg/dL (ref 6–23)
CALCIUM: 9.8 mg/dL (ref 8.4–10.5)
CHLORIDE: 105 meq/L (ref 96–112)
CO2: 28 meq/L (ref 19–32)
CREATININE: 0.82 mg/dL (ref 0.40–1.50)
GFR: 104.35 mL/min (ref >60.00)
Glucose, Bld: 97 mg/dL (ref 70–99)
POTASSIUM: 4.2 meq/L (ref 3.5–5.1)
SODIUM: 140 meq/L (ref 135–145)
Total Bilirubin: 0.8 mg/dL (ref 0.2–1.2)
Total Protein: 7.5 g/dL (ref 6.0–8.3)

## 2016-07-26 NOTE — Assessment & Plan Note (Signed)
Asymptomatic. Lost a lot of weight and had improvement in his symptoms. He'll continue to monitor.

## 2016-07-26 NOTE — Assessment & Plan Note (Signed)
Patient reports about once a month having right red blood on the tissue when he wipes after having a bowel movement. Does appear to have skin tags which could've been prior hemorrhoids. He has never had a colonoscopy. Given lack of prior colonoscopy we will refer to GI for evaluation of this bleeding and for colonoscopy.

## 2016-07-26 NOTE — Progress Notes (Signed)
Pre visit review using our clinic review tool, if applicable. No additional management support is needed unless otherwise documented below in the visit note. 

## 2016-07-26 NOTE — Patient Instructions (Signed)
Nice to see you.  We will get you referred to GI for colonoscopy. Please continue to check your blood pressure at home. I would like for you to return in 1 week to have it checked by a nurse and please bring your blood pressure cuff in.

## 2016-07-26 NOTE — Progress Notes (Signed)
  [name], MD Phone: [phone]  Stuart Bruce is a 56 y.o. male who presents today for f/u.  HYPERTENSION  Disease Monitoring  Home BP Monitoring 120-140/80's Chest pain- no    Dyspnea- no Medications  Compliance-  Taking cardura, fosinopril.   Edema- no  OSA: Patient was never on CPAP. Notes no hypersomnia or apneic episodes. Does snore. Notes his symptoms improved significantly after losing about 40 pounds.  Patient notes about once a month he has an issue with some rectal bleeding. Has had hemorrhoids and bleeds a little bit when he wipes his rectum. Only occurs after bowel movements. Notes it is bright red blood. No prior colonoscopy.   PMH: nonsmoker.   ROS see history of present illness  Objective  Physical Exam Vitals:   07/26/16 1334  BP: (!) 156/92  Pulse: 91  Temp: 98.4 F (36.9 C)    BP Readings from Last 3 Encounters:  07/26/16 (!) 156/92  02/18/16 128/86  07/26/15 120/80   Wt Readings from Last 3 Encounters:  07/26/16 202 lb 6.4 oz (91.8 kg)  02/18/16 193 lb 9.6 oz (87.8 kg)  07/26/15 192 lb 3.2 oz (87.2 kg)    Physical Exam  Constitutional: No distress.  HENT:  Head: Normocephalic and atraumatic.  Cardiovascular: Normal rate, regular rhythm and normal heart sounds.   Pulmonary/Chest: Effort normal and breath sounds normal.  Genitourinary:  Genitourinary Comments: Small skin tags noted on the rectum, negative guaiac  Musculoskeletal: He exhibits no edema.  Neurological: He is alert. Gait normal.  Skin: He is not diaphoretic.     Assessment/Plan: Please see individual problem list.  Benign essential HTN Elevated today though seems to be well controlled at home. We will have him return in about a week with his blood pressure monitor and have a nurse check his blood pressure and compare it to his monitor. He will continue current medications.  Obstructive sleep apnea Asymptomatic. Lost a lot of weight and had improvement in his  symptoms. He'll continue to monitor.  BRBPR (bright red blood per rectum) Patient reports about once a month having right red blood on the tissue when he wipes after having a bowel movement. Does appear to have skin tags which could've been prior hemorrhoids. He has never had a colonoscopy. Given lack of prior colonoscopy we will refer to GI for evaluation of this bleeding and for colonoscopy.   Orders Placed This Encounter  Procedures  . Comp Met (CMET)  . Ambulatory referral to Gastroenterology    Referral Priority:   Routine    Referral Type:   Consultation    Referral Reason:   Specialty Services Required    Number of Visits Requested:   [place], MD [place]

## 2016-07-26 NOTE — Assessment & Plan Note (Signed)
Elevated today though seems to be well controlled at home. We will have him return in about a week with his blood pressure monitor and have a nurse check his blood pressure and compare it to his monitor. He will continue current medications.

## 2016-07-28 ENCOUNTER — Other Ambulatory Visit: Payer: Self-pay | Admitting: Family Medicine

## 2016-07-28 DIAGNOSIS — R7989 Other specified abnormal findings of blood chemistry: Secondary | ICD-10-CM

## 2016-07-28 DIAGNOSIS — R945 Abnormal results of liver function studies: Principal | ICD-10-CM

## 2016-08-02 ENCOUNTER — Other Ambulatory Visit: Payer: Self-pay | Admitting: Family Medicine

## 2016-08-02 ENCOUNTER — Encounter: Payer: Self-pay | Admitting: Internal Medicine

## 2016-08-02 ENCOUNTER — Ambulatory Visit (INDEPENDENT_AMBULATORY_CARE_PROVIDER_SITE_OTHER): Payer: 59

## 2016-08-02 VITALS — BP 148/94 | HR 61 | Resp 16

## 2016-08-02 DIAGNOSIS — I10 Essential (primary) hypertension: Secondary | ICD-10-CM

## 2016-08-02 MED ORDER — FOSINOPRIL SODIUM 20 MG PO TABS: 20.0000 mg | ORAL_TABLET | Freq: Every day | ORAL | 3 refills | Status: DC

## 2016-08-02 NOTE — Progress Notes (Signed)
Patient comes in for blood pressure check.   Checked with his home blood pressure machine left arm 154/93.  He states blood pressure at home has been averaging around  135/88.   Per verbal orders from Dr Caryl Bis will increase Fosinopril and re check blood pressure and labs in 1 week .  Informed patient to continue to monitor blood pressure at home.  Patient verbalized an understanding.   Please advise .

## 2016-08-03 NOTE — Progress Notes (Signed)
I have reviewed the above note and agree. BP elevated above goal. Increase fosinopril. Recheck lab work in 1 week.  [name], M.D.

## 2016-08-05 ENCOUNTER — Encounter: Payer: Self-pay | Admitting: Family Medicine

## 2016-08-07 MED ORDER — DOXAZOSIN MESYLATE 8 MG PO TABS: 8.0000 mg | ORAL_TABLET | Freq: Every day | ORAL | 1 refills | Status: DC

## 2016-08-09 ENCOUNTER — Other Ambulatory Visit (INDEPENDENT_AMBULATORY_CARE_PROVIDER_SITE_OTHER): Payer: 59

## 2016-08-09 ENCOUNTER — Encounter: Payer: Self-pay | Admitting: *Deleted

## 2016-08-09 ENCOUNTER — Ambulatory Visit (INDEPENDENT_AMBULATORY_CARE_PROVIDER_SITE_OTHER): Payer: 59 | Admitting: *Deleted

## 2016-08-09 VITALS — BP 154/100 | HR 70

## 2016-08-09 DIAGNOSIS — R7989 Other specified abnormal findings of blood chemistry: Secondary | ICD-10-CM | POA: Diagnosis not present

## 2016-08-09 DIAGNOSIS — I10 Essential (primary) hypertension: Secondary | ICD-10-CM | POA: Diagnosis not present

## 2016-08-09 DIAGNOSIS — R945 Abnormal results of liver function studies: Principal | ICD-10-CM

## 2016-08-09 NOTE — Progress Notes (Signed)
Patient presented for 1 week BP check from increase in Fosinopril to 20 mg daily. BP was attained in left arm after 8 minutes sitting in room  BP  158/102 pulse 85 , attained BP in right arm after five to eight minutes of rest 154/100 pulse 70 02 sat @ 97 . Patient was allowed to rest 15 additional minutes while nurse spoke with PCP and then BP attained again in left arm 148/98 pulse 68.  PCP gave verbal for BP check.upon patient arrival. Also reviewed finding with PCP before letting patient leave.

## 2016-08-10 LAB — HEPATIC FUNCTION PANEL
ALBUMIN: 4.5 g/dL (ref 3.5–5.2)
ALT: 51 U/L (ref 0–53)
AST: 38 U/L — AB (ref 0–37)
Alkaline Phosphatase: 29 U/L — ABNORMAL LOW (ref 39–117)
BILIRUBIN TOTAL: 0.6 mg/dL (ref 0.2–1.2)
Bilirubin, Direct: 0.1 mg/dL (ref 0.0–0.3)
Total Protein: 7.4 g/dL (ref 6.0–8.3)

## 2016-08-10 LAB — COMPREHENSIVE METABOLIC PANEL
ALK PHOS: 29 U/L — AB (ref 39–117)
ALT: 51 U/L (ref 0–53)
AST: 38 U/L — AB (ref 0–37)
Albumin: 4.5 g/dL (ref 3.5–5.2)
BUN: 18 mg/dL (ref 6–23)
CO2: 29 mEq/L (ref 19–32)
CREATININE: 0.84 mg/dL (ref 0.40–1.50)
Calcium: 10.3 mg/dL (ref 8.4–10.5)
Chloride: 104 mEq/L (ref 96–112)
GFR: 101.47 mL/min (ref >60.00)
GLUCOSE: 130 mg/dL — AB (ref 70–99)
POTASSIUM: 4.5 meq/L (ref 3.5–5.1)
SODIUM: 138 meq/L (ref 135–145)
Total Bilirubin: 0.6 mg/dL (ref 0.2–1.2)
Total Protein: 7.4 g/dL (ref 6.0–8.3)

## 2016-08-11 MED ORDER — AMLODIPINE BESYLATE 5 MG PO TABS: 5.0000 mg | ORAL_TABLET | Freq: Every day | ORAL | 3 refills | Status: DC

## 2016-08-11 NOTE — Addendum Note (Signed)
Addended by: Leone Haven on: 08/11/2016 06:57 PM   Modules accepted: Orders

## 2016-08-11 NOTE — Progress Notes (Signed)
Verbal order was given for blood pressure check. Blood pressure remains elevated. We'll send in amlodipine for the patient to start on.  [name], M.D.

## 2016-08-14 NOTE — Progress Notes (Signed)
Patient notified and voiced understanding.

## 2016-08-15 ENCOUNTER — Other Ambulatory Visit: Payer: 59

## 2016-09-11 ENCOUNTER — Ambulatory Visit (AMBULATORY_SURGERY_CENTER): Payer: Self-pay | Admitting: *Deleted

## 2016-09-11 ENCOUNTER — Encounter: Payer: Self-pay | Admitting: Internal Medicine

## 2016-09-11 VITALS — Ht 67.0 in | Wt 207.0 lb

## 2016-09-11 DIAGNOSIS — Z1211 Encounter for screening for malignant neoplasm of colon: Secondary | ICD-10-CM

## 2016-09-11 MED ORDER — NA SULFATE-K SULFATE-MG SULF 17.5-3.13-1.6 GM/177ML PO SOLN: ORAL | 0 refills | Status: DC

## 2016-09-11 NOTE — Progress Notes (Signed)
Patient denies any allergies to eggs or soy. Patient denies any problems with anesthesia/sedation. Patient denies any oxygen use at home and does not take any diet/weight loss medications. EMMI education assisgned to patient on colonoscopy, this was explained and instructions given to patient. 

## 2016-09-25 ENCOUNTER — Encounter: Payer: Self-pay | Admitting: Internal Medicine

## 2016-09-25 ENCOUNTER — Ambulatory Visit (AMBULATORY_SURGERY_CENTER): Payer: 59 | Admitting: Internal Medicine

## 2016-09-25 VITALS — BP 118/84 | HR 76 | Temp 98.0°F | Resp 14 | Ht 67.0 in | Wt 207.0 lb

## 2016-09-25 DIAGNOSIS — K635 Polyp of colon: Secondary | ICD-10-CM

## 2016-09-25 DIAGNOSIS — D123 Benign neoplasm of transverse colon: Secondary | ICD-10-CM | POA: Diagnosis not present

## 2016-09-25 DIAGNOSIS — Z1212 Encounter for screening for malignant neoplasm of rectum: Secondary | ICD-10-CM | POA: Diagnosis not present

## 2016-09-25 DIAGNOSIS — Z1211 Encounter for screening for malignant neoplasm of colon: Secondary | ICD-10-CM

## 2016-09-25 DIAGNOSIS — K621 Rectal polyp: Secondary | ICD-10-CM

## 2016-09-25 DIAGNOSIS — D128 Benign neoplasm of rectum: Secondary | ICD-10-CM

## 2016-09-25 DIAGNOSIS — D129 Benign neoplasm of anus and anal canal: Secondary | ICD-10-CM

## 2016-09-25 MED ORDER — SODIUM CHLORIDE 0.9 % IV SOLN: 500.0000 mL | INTRAVENOUS | Status: DC

## 2016-09-25 NOTE — Progress Notes (Signed)
Report given to PACU, vss 

## 2016-09-25 NOTE — Progress Notes (Signed)
Pt. Reports no change in surgical or medical history since pre-visit 09/11/2016.

## 2016-09-25 NOTE — Progress Notes (Signed)
Called to room to assist during endoscopic procedure.  Patient ID and intended procedure confirmed with present staff. Received instructions for my participation in the procedure from the performing physician.  

## 2016-09-25 NOTE — Op Note (Signed)
[facility] Patient Name: Stuart Bruce Procedure Date: 09/25/2016 2:41 PM MRN: [id number] Endoscopist: Jerene Bears , MD Age: 56 Referring MD:  Date of Birth: [birth year] Gender: Male Account #: [id number] Procedure:                Colonoscopy Indications:              Screening for colorectal malignant neoplasm, This                            is the patient's first colonoscopy Medicines:                Monitored Anesthesia Care Procedure:                Pre-Anesthesia Assessment:                           - Prior to the procedure, a History and Physical                            was performed, and patient medications and                            allergies were reviewed. The patient's tolerance of                            previous anesthesia was also reviewed. The risks                            and benefits of the procedure and the sedation                            options and risks were discussed with the patient.                            All questions were answered, and informed consent                            was obtained. Prior Anticoagulants: The patient has                            taken no previous anticoagulant or antiplatelet                            agents. ASA Grade Assessment: II - A patient with                            mild systemic disease. After reviewing the risks                            and benefits, the patient was deemed in                            satisfactory condition to undergo the procedure.  After obtaining informed consent, the colonoscope                            was passed under direct vision. Throughout the                            procedure, the patient's blood pressure, pulse, and                            oxygen saturations were monitored continuously. The                            Colonoscope was introduced through the anus and                            advanced to the the cecum,  identified by                            appendiceal orifice and ileocecal valve. The                            colonoscopy was performed without difficulty. The                            patient tolerated the procedure well. The quality                            of the bowel preparation was good. The ileocecal                            valve, appendiceal orifice, and rectum were                            photographed. Scope In: 2:58:09 PM Scope Out: 3:14:32 PM Scope Withdrawal Time: 0 hours 14 minutes 12 seconds  Total Procedure Duration: 0 hours 16 minutes 23 seconds  Findings:                 The digital rectal exam was normal.                           A 5 mm polyp was found in the transverse colon. The                            polyp was sessile. The polyp was removed with a                            cold snare. Resection and retrieval were complete.                           A 4 mm polyp was found in the hepatic flexure. The                            polyp was sessile. The polyp was  removed with a                            cold snare. Resection and retrieval were complete.                           A 7 mm polyp was found in the rectum. The polyp was                            semi-pedunculated. The polyp was removed with a hot                            snare. Resection and retrieval were complete.                           Internal hemorrhoids were found during                            retroflexion. The hemorrhoids were medium-sized. Complications:            No immediate complications. Estimated Blood Loss:     Estimated blood loss was minimal. Impression:               - One 5 mm polyp in the transverse colon, removed                            with a cold snare. Resected and retrieved.                           - One 4 mm polyp at the hepatic flexure, removed                            with a cold snare. Resected and retrieved.                           - One 7 mm  polyp in the rectum, removed with a hot                            snare. Resected and retrieved.                           - Internal hemorrhoids. Recommendation:           - Patient has a contact number available for                            emergencies. The signs and symptoms of potential                            delayed complications were discussed with the                            patient. Return to normal activities tomorrow.  Written discharge instructions were provided to the                            patient.                           - Resume previous diet.                           - Continue present medications.                           - No ibuprofen, naproxen, or other non-steroidal                            anti-inflammatory drugs for 3 weeks after polyp                            removal.                           - Await pathology results.                           - Repeat colonoscopy for surveillance based on                            pathology results. Jerene Bears, MD 09/25/2016 3:18:22 PM This report has been signed electronically.

## 2016-09-25 NOTE — Patient Instructions (Signed)
YOU HAD AN ENDOSCOPIC PROCEDURE TODAY AT [facility]:   Refer to the procedure report that was given to you for any specific questions about what was found during the examination.  If the procedure report does not answer your questions, please call your gastroenterologist to clarify.  If you requested that your care partner not be given the details of your procedure findings, then the procedure report has been included in a sealed envelope for you to review at your convenience later.  YOU SHOULD EXPECT: Some feelings of bloating in the abdomen. Passage of more gas than usual.  Walking can help get rid of the air that was put into your GI tract during the procedure and reduce the bloating. If you had a lower endoscopy (such as a colonoscopy or flexible sigmoidoscopy) you may notice spotting of blood in your stool or on the toilet paper. If you underwent a bowel prep for your procedure, you may not have a normal bowel movement for a few days.  Please Note:  You might notice some irritation and congestion in your nose or some drainage.  This is from the oxygen used during your procedure.  There is no need for concern and it should clear up in a day or so.  SYMPTOMS TO REPORT IMMEDIATELY:   Following lower endoscopy (colonoscopy or flexible sigmoidoscopy):  Excessive amounts of blood in the stool  Significant tenderness or worsening of abdominal pains  Swelling of the abdomen that is new, acute  Fever of 100F or higher   For urgent or emergent issues, a gastroenterologist can be reached at any hour by calling [phone].   DIET:  We do recommend a small meal at first, but then you may proceed to your regular diet.  Drink plenty of fluids but you should avoid alcoholic beverages for 24 hours.  ACTIVITY:  You should plan to take it easy for the rest of today and you should NOT DRIVE or use heavy machinery until tomorrow (because of the sedation medicines used during the test).     FOLLOW UP: Our staff will call the number listed on your records the next business day following your procedure to check on you and address any questions or concerns that you may have regarding the information given to you following your procedure. If we do not reach you, we will leave a message.  However, if you are feeling well and you are not experiencing any problems, there is no need to return our call.  We will assume that you have returned to your regular daily activities without incident.  If any biopsies were taken you will be contacted by phone or by letter within the next 1-3 weeks.  Please call us at [phone] if you have not heard about the biopsies in 3 weeks.    SIGNATURES/CONFIDENTIALITY: You and/or your care partner have signed paperwork which will be entered into your electronic medical record.  These signatures attest to the fact that that the information above on your After Visit Summary has been reviewed and is understood.  Full responsibility of the confidentiality of this discharge information lies with you and/or your care-partner.  No NSAIDS for three weeks due to the cautery use.   Read all of the handouts the recovery room nurse gave you.  You may take your aspirin for your heart.

## 2016-09-26 ENCOUNTER — Telehealth: Payer: Self-pay

## 2016-09-26 NOTE — Telephone Encounter (Signed)
  Follow up Call-  Call back number 09/25/2016  Post procedure Call Back phone  # [phone]  Permission to leave phone message No  Some recent data might be hidden     Patient questions:  Do you have a fever, pain , or abdominal swelling? No. Pain Score  0 *  Have you tolerated food without any problems? Yes.    Have you been able to return to your normal activities? Yes.    Do you have any questions about your discharge instructions: Diet   No. Medications  No. Follow up visit  No.  Do you have questions or concerns about your Care? No.  Actions: * If pain score is 4 or above: No action needed, pain <4.

## 2016-09-29 ENCOUNTER — Encounter: Payer: Self-pay | Admitting: Internal Medicine

## 2016-11-14 ENCOUNTER — Ambulatory Visit: Payer: 59 | Admitting: Family Medicine

## 2016-11-21 ENCOUNTER — Ambulatory Visit: Payer: 59 | Admitting: Family Medicine

## 2017-01-05 ENCOUNTER — Ambulatory Visit: Payer: 59 | Admitting: Family Medicine

## 2017-01-23 ENCOUNTER — Encounter: Payer: Self-pay | Admitting: Family Medicine

## 2017-01-23 ENCOUNTER — Ambulatory Visit (INDEPENDENT_AMBULATORY_CARE_PROVIDER_SITE_OTHER): Payer: 59 | Admitting: Family Medicine

## 2017-01-23 VITALS — BP 140/92 | HR 83 | Temp 98.1°F | Ht 67.0 in | Wt 204.6 lb

## 2017-01-23 DIAGNOSIS — I10 Essential (primary) hypertension: Secondary | ICD-10-CM

## 2017-01-23 DIAGNOSIS — G4733 Obstructive sleep apnea (adult) (pediatric): Secondary | ICD-10-CM

## 2017-01-23 DIAGNOSIS — K625 Hemorrhage of anus and rectum: Secondary | ICD-10-CM | POA: Diagnosis not present

## 2017-01-23 MED ORDER — AMLODIPINE BESYLATE 10 MG PO TABS: 10.0000 mg | ORAL_TABLET | Freq: Every day | ORAL | 3 refills | Status: DC

## 2017-01-23 NOTE — Assessment & Plan Note (Signed)
Patient notes no recurrence. Recent colonoscopy with internal hemorrhoids and 3 polyps. Monitor for recurrence.

## 2017-01-23 NOTE — Assessment & Plan Note (Signed)
Asymptomatic. Monitor for any recurrent symptoms. If has recurrent symptoms consider repeat sleep study.

## 2017-01-23 NOTE — Progress Notes (Signed)
  [name], MD Phone: [phone]  Stuart Bruce is a 56 y.o. male who presents today for follow-up.  Hypertension: Typically in the 130s over 80s. Taking amlodipine, Cardura, fosinopril. No chest pain or shortness of breath. Notes rare edema in his lower extremities that goes down with propping his legs up. No orthopnea.  He had colonoscopy through GI for rectal bleeding. He notes they found 3 polyps that were precancerous. They advised follow-up in 5 years. He's not had any recurrent bleeding.  OSA: Notes he was never on CPAP. He lost weight and symptoms improved. He notes no hypersomnia. He does wake well rested.  PMH: nonsmoker.   ROS see history of present illness  Objective  Physical Exam Vitals:   01/23/17 1519 01/23/17 1546  BP: (!) 142/92 (!) 140/92  Pulse: 83   Temp: 98.1 F (36.7 C)   SpO2: 98%     BP Readings from Last 3 Encounters:  01/23/17 (!) 140/92  09/25/16 118/84  08/09/16 (!) 154/100   Wt Readings from Last 3 Encounters:  01/23/17 204 lb 9.6 oz (92.8 kg)  09/25/16 207 lb (93.9 kg)  09/11/16 207 lb (93.9 kg)    Physical Exam  Constitutional: No distress.  Cardiovascular: Normal rate, regular rhythm and normal heart sounds.   Pulmonary/Chest: Effort normal and breath sounds normal.  Musculoskeletal: He exhibits no edema.  Neurological: He is alert. Gait normal.  Skin: Skin is warm and dry. He is not diaphoretic.     Assessment/Plan: Please see individual problem list.  Benign essential HTN Above goal at home and in the office. We'll increase amlodipine. If he has increased swelling with this he'll let us know. Discussed possibility of constipation with this. He'll continue his other medications. We will follow-up in one month for BP check.  Obstructive sleep apnea Asymptomatic. Monitor for any recurrent symptoms. If has recurrent symptoms consider repeat sleep study.  BRBPR (bright red blood per rectum) Patient notes no recurrence.  Recent colonoscopy with internal hemorrhoids and 3 polyps. Monitor for recurrence.   Orders Placed This Encounter  Procedures  . Comp Met (CMET)    Standing Status:   Future    Standing Expiration Date:   01/23/2018  . HgB A1c    Standing Status:   Future    Standing Expiration Date:   01/23/2018  . Lipid panel    Standing Status:   Future    Standing Expiration Date:   01/23/2018    Meds ordered this encounter  Medications  . UNABLE TO FIND    Sig: Med Name:Nugenix 3 capsules daily  . amLODipine (NORVASC) 10 MG tablet    Sig: Take 1 tablet (10 mg total) by mouth daily.    Dispense:  90 tablet    Refill:  [place], MD [place]

## 2017-01-23 NOTE — Assessment & Plan Note (Signed)
Above goal at home and in the office. We'll increase amlodipine. If he has increased swelling with this he'll let us know. Discussed possibility of constipation with this. He'll continue his other medications. We will follow-up in one month for BP check.

## 2017-01-23 NOTE — Patient Instructions (Signed)
Nice to see you. We'll have you increase your amlodipine to 10 mg daily. You may take two 5 mg tablets by mouth daily until you run out of your current prescription. You can then start the 10 mg tablets. We will have you follow-up in one month for recheck of her blood pressure with the nurse.

## 2017-02-26 ENCOUNTER — Other Ambulatory Visit: Payer: Self-pay | Admitting: Family Medicine

## 2017-02-28 ENCOUNTER — Other Ambulatory Visit: Payer: 59

## 2017-03-07 ENCOUNTER — Other Ambulatory Visit (INDEPENDENT_AMBULATORY_CARE_PROVIDER_SITE_OTHER): Payer: 59

## 2017-03-07 ENCOUNTER — Ambulatory Visit (INDEPENDENT_AMBULATORY_CARE_PROVIDER_SITE_OTHER): Payer: 59 | Admitting: *Deleted

## 2017-03-07 VITALS — BP 136/76 | HR 70 | Resp 18

## 2017-03-07 DIAGNOSIS — I10 Essential (primary) hypertension: Secondary | ICD-10-CM

## 2017-03-07 NOTE — Progress Notes (Signed)
Patient presented for one month BP check after increasing amlodipine to 10 mg daily, patient BP taken left arm 136/76 pulse 70 .

## 2017-03-08 LAB — COMPREHENSIVE METABOLIC PANEL
ALT: 56 U/L — AB (ref 0–53)
AST: 35 U/L (ref 0–37)
Albumin: 4.7 g/dL (ref 3.5–5.2)
Alkaline Phosphatase: 25 U/L — ABNORMAL LOW (ref 39–117)
BILIRUBIN TOTAL: 0.9 mg/dL (ref 0.2–1.2)
BUN: 10 mg/dL (ref 6–23)
CO2: 26 meq/L (ref 19–32)
CREATININE: 0.74 mg/dL (ref 0.40–1.50)
Calcium: 9.6 mg/dL (ref 8.4–10.5)
Chloride: 100 mEq/L (ref 96–112)
GFR: 117.2 mL/min (ref >60.00)
GLUCOSE: 94 mg/dL (ref 70–99)
Potassium: 3.8 mEq/L (ref 3.5–5.1)
SODIUM: 137 meq/L (ref 135–145)
Total Protein: 7.5 g/dL (ref 6.0–8.3)

## 2017-03-08 LAB — LIPID PANEL
Cholesterol: 209 mg/dL — ABNORMAL HIGH (ref 0–200)
HDL: 95.7 mg/dL (ref >39.00)
LDL CALC: 98 mg/dL (ref 0–99)
NonHDL: 113.42
Total CHOL/HDL Ratio: 2
Triglycerides: 76 mg/dL (ref 0.0–149.0)
VLDL: 15.2 mg/dL (ref 0.0–40.0)

## 2017-03-08 LAB — HEMOGLOBIN A1C: Hgb A1c MFr Bld: 5.5 % (ref 4.6–6.5)

## 2017-03-10 ENCOUNTER — Other Ambulatory Visit: Payer: Self-pay | Admitting: Family Medicine

## 2017-03-10 DIAGNOSIS — R945 Abnormal results of liver function studies: Principal | ICD-10-CM

## 2017-03-10 DIAGNOSIS — R7989 Other specified abnormal findings of blood chemistry: Secondary | ICD-10-CM

## 2017-03-10 NOTE — Progress Notes (Signed)
Blood pressure improved. Please continue current medication. Follow-up in 3 months.  [name], M.D.

## 2017-03-12 NOTE — Progress Notes (Signed)
Patient notified and voiced understanding.

## 2017-03-19 ENCOUNTER — Ambulatory Visit: Payer: 59

## 2017-03-27 ENCOUNTER — Ambulatory Visit: Payer: 59

## 2017-04-03 DIAGNOSIS — Z23 Encounter for immunization: Secondary | ICD-10-CM | POA: Diagnosis not present

## 2017-04-19 ENCOUNTER — Ambulatory Visit
Admission: RE | Admit: 2017-04-19 | Discharge: 2017-04-19 | Disposition: A | Discharge status: Home / Self Care | Payer: 59 | Source: Ambulatory Visit | Attending: Family Medicine | Admitting: Family Medicine

## 2017-04-19 DIAGNOSIS — R7989 Other specified abnormal findings of blood chemistry: Secondary | ICD-10-CM | POA: Insufficient documentation

## 2017-04-19 DIAGNOSIS — R932 Abnormal findings on diagnostic imaging of liver and biliary tract: Secondary | ICD-10-CM | POA: Insufficient documentation

## 2017-04-19 DIAGNOSIS — R945 Abnormal results of liver function studies: Secondary | ICD-10-CM

## 2017-07-01 ENCOUNTER — Other Ambulatory Visit: Payer: Self-pay | Admitting: Family Medicine

## 2017-08-21 ENCOUNTER — Other Ambulatory Visit: Payer: Self-pay | Admitting: Family Medicine

## 2017-11-15 ENCOUNTER — Other Ambulatory Visit: Payer: Self-pay | Admitting: Family Medicine

## 2018-02-06 ENCOUNTER — Other Ambulatory Visit: Payer: Self-pay | Admitting: Family Medicine

## 2018-03-09 ENCOUNTER — Other Ambulatory Visit: Payer: Self-pay | Admitting: Family Medicine

## 2018-03-12 NOTE — Telephone Encounter (Signed)
Patient needs follow-up to receive a refill. Please call the patient to arrange follow-up. He could have labs drawn for a BMET prior to his appointment and receive a short term refill if he agrees to labs. Thanks.

## 2018-03-12 NOTE — Telephone Encounter (Signed)
Last OV8/18 please advise to refill.

## 2018-03-19 ENCOUNTER — Telehealth: Payer: Self-pay

## 2018-03-19 NOTE — Telephone Encounter (Signed)
Fax from [facility] request for FOSINOPRIL 20 MG   Last OV 01/23/2017   Pt needs an OV for more refilled. Call pt to schedule for an appt.

## 2018-03-21 NOTE — Telephone Encounter (Signed)
Last OV 03/10/2017   Last refilled 08/23/2017 disp 90 with no refills   Sent to PCP to advise

## 2018-03-22 NOTE — Telephone Encounter (Signed)
Patient needs an office visit for further refills. It has been over a year since he was last seen. Please get him scheduled for an appointment. Thanks.

## 2018-03-26 NOTE — Telephone Encounter (Signed)
Called pt and left a detailed VM explaining to pt that he will need a OV with Caryl Bis since it has been a year since last seen in order to get refills for their prescription Fosinopril 20 MG.   Did ask patient to call us back to get schedule for an appt.   CRM created and sent to [facility] pool.

## 2018-05-07 ENCOUNTER — Other Ambulatory Visit: Payer: Self-pay | Admitting: Family Medicine

## 2018-05-08 ENCOUNTER — Other Ambulatory Visit: Payer: Self-pay | Admitting: Family Medicine

## 2018-05-10 NOTE — Telephone Encounter (Signed)
Detailed message left for patient to return call to office.

## 2018-05-24 NOTE — Telephone Encounter (Signed)
Left message to return call to office.

## 2018-08-06 ENCOUNTER — Other Ambulatory Visit: Payer: Self-pay | Admitting: Family Medicine

## 2018-08-28 ENCOUNTER — Telehealth: Payer: Self-pay | Admitting: Family Medicine

## 2018-08-28 ENCOUNTER — Telehealth: Payer: Self-pay

## 2018-08-28 ENCOUNTER — Other Ambulatory Visit: Payer: Self-pay

## 2018-08-28 ENCOUNTER — Telehealth (INDEPENDENT_AMBULATORY_CARE_PROVIDER_SITE_OTHER): Payer: BLUE CROSS/BLUE SHIELD | Admitting: Family Medicine

## 2018-08-28 DIAGNOSIS — J989 Respiratory disorder, unspecified: Secondary | ICD-10-CM | POA: Insufficient documentation

## 2018-08-28 DIAGNOSIS — I10 Essential (primary) hypertension: Secondary | ICD-10-CM

## 2018-08-28 MED ORDER — AMLODIPINE BESYLATE 10 MG PO TABS: 5.0000 mg | ORAL_TABLET | Freq: Every day | ORAL | 1 refills | Status: DC

## 2018-08-28 MED ORDER — FOSINOPRIL SODIUM 10 MG PO TABS: ORAL_TABLET | ORAL | 1 refills | Status: DC

## 2018-08-28 NOTE — Progress Notes (Signed)
Virtual Visit via Video Note  I connected with Tawny Asal on 08/28/18 at  3:30 PM EDT by a video enabled telemedicine application and verified that I am speaking with the correct person using two identifiers.  Location patient: home Location provider:work Persons participating in the virtual visit: patient, provider  I discussed the limitations of evaluation and management by telemedicine and the availability of in person appointments. The patient expressed understanding and agreed to proceed.   HPI: HYPERTENSION  Disease Monitoring  Home BP Monitoring 130s-150/80's-90s Chest pain- no    Dyspnea- no Medications  Compliance-  Taking amlodipine and doxazosin. Patient was on fosinopril previously. He has been out of this for several months. Edema- yes, bilateral lower extremities, goes down over night, no orthopnea, no PND  Respiratory illness: patient notes onset of symptoms 3/18 with sore throat. This lasted until 3/22 then he developed cough and rhinorrhea. Notes these symptoms have been improving. No fever. No dyspnea. He did travel to [place] prior to becoming ill. He notes no sick contacts.     ROS: See pertinent positives and negatives per HPI.  Past Medical History:  Diagnosis Date  . Depression   . Hypertension   . OSA (obstructive sleep apnea)    no CPAP use    Past Surgical History:  Procedure Laterality Date  . HAND SURGERY Right 2005   Cyst on ring finger    Family History  Problem Relation Age of Onset  . Cancer Mother        Breast  . Diabetes Father   . Cancer Father        Renal  . Drug abuse Paternal Aunt   . Colon cancer Neg Hx     SOCIAL HX: nonsmoker   Current Outpatient Medications:  .  amLODipine (NORVASC) 10 MG tablet, Take 0.5 tablets (5 mg total) by mouth daily., Disp: 45 tablet, Rfl: 1 .  Ascorbic Acid (VITAMIN C) 1000 MG tablet, Take 1,000 mg by mouth daily., Disp: , Rfl:  .  B Complex Vitamins (B COMPLEX 100 PO), Take 1 capsule by  mouth daily., Disp: , Rfl:  .  Coenzyme Q10 (CO Q-10) 200 MG CAPS, Take 1 capsule by mouth daily., Disp: , Rfl:  .  doxazosin (CARDURA) 8 MG tablet, TAKE 1 TABLET(8 MG) BY MOUTH DAILY, Disp: 90 tablet, Rfl: 0 .  fosinopril (MONOPRIL) 10 MG tablet, TAKE 1 TABLET(10 MG) BY MOUTH DAILY, Disp: 90 tablet, Rfl: 1 .  Maca Root (MACA PO), Take 525 mg by mouth daily., Disp: , Rfl:  .  Milk Thistle 300 MG CAPS, Take 1 capsule by mouth daily., Disp: , Rfl:  .  UNABLE TO FIND, Med Name:Nugenix 3 capsules daily, Disp: , Rfl:   Current Facility-Administered Medications:  .  0.9 %  sodium chloride infusion, 500 mL, Intravenous, Continuous, Pyrtle, Lajuan Lines, MD  EXAM:  VITALS per patient if applicable:   GENERAL: alert, oriented, appears well and in no acute distress  HEENT: atraumatic, conjunttiva clear, no obvious abnormalities on inspection of external nose and ears  NECK: normal movements of the head and neck  LUNGS: on inspection no signs of respiratory distress, breathing rate appears normal, no obvious gross SOB, gasping or wheezing  CV: no obvious cyanosis  MS: moves all visible extremities without noticeable abnormality  PSYCH/NEURO: pleasant and cooperative, no obvious depression or anxiety, speech and thought processing grossly intact  ASSESSMENT AND PLAN:  Discussed the following assessment and plan:  Essential hypertension - Plan:  Basic Metabolic Panel (BMET)  Benign essential HTN  Respiratory illness  Benign essential HTN Slightly uncontrolled. We will decrease his amlodipine dose to 5 mg daily given his swelling and add back fosinopril. He previously tolerated the fosinopril well. He will need lab work in 14 days and I will have the [facility] contact him to set this up. Discussed the importance of this.   Respiratory illness Patient with improvement in respiratory illness after travel to [place]. He is well appearing and in no distress. Discussed given his travel and his  symptoms that he should self isolate for 14 days or until 3 consecutive days without fever and improvement in symptoms, and at least 7 days have passed since symptoms first appeared. He verbalized understanding. Discussed calling for any worsening symptoms and discussed return precautions.      I discussed the assessment and treatment plan with the patient. The patient was provided an opportunity to ask questions and all were answered. The patient agreed with the plan and demonstrated an understanding of the instructions.   The patient was advised to call back or seek an in-person evaluation if the symptoms worsen or if the condition fails to improve as anticipated.  I provided 15 minutes of non-face-to-face time during this encounter.   [name], MD

## 2018-08-28 NOTE — Telephone Encounter (Signed)
Please contact the patient to get him set up for a lab appointment and nurse BP check in 2 weeks. Thanks.

## 2018-08-28 NOTE — Telephone Encounter (Addendum)
Patient scheduled today for follow up with pcp. Appointment note reads "cough". Second call placed for COVID-19 screening.  Patient, states he did return from [place] 2 weeks (3/12) and was not around anyone with COVID-19 symptoms. He had a sore throat starting last Wednesday (3/18) and ending Sunday (3/22), which then produced a cough and runny nose.  Denies cough today. Denies runny nose, but states " I do need to blow my nose, but it is not runny."  Notes he has had NO fever, chills, muscle aches, N/V, headache or ABD pain. Patient made aware his physician will assess to determine if reschedule is needed at this time.

## 2018-08-28 NOTE — Assessment & Plan Note (Signed)
Slightly uncontrolled. We will decrease his amlodipine dose to 5 mg daily given his swelling and add back fosinopril. He previously tolerated the fosinopril well. He will need lab work in 14 days and I will have the [facility] contact him to set this up. Discussed the importance of this.

## 2018-08-28 NOTE — Telephone Encounter (Signed)
I would advise that he not come in to the office for his visit. We can do a virtual visit if he is willing or reschedule for some time in 2 weeks. He should do home isolation for 14 days or until he is 3 consecutive days with improvement in symptoms and at least 7 days have passed since onset of symptoms. Thanks.

## 2018-08-28 NOTE — Assessment & Plan Note (Signed)
Patient with improvement in respiratory illness after travel to [place]. He is well appearing and in no distress. Discussed given his travel and his symptoms that he should self isolate for 14 days or until 3 consecutive days without fever and improvement in symptoms, and at least 7 days have passed since symptoms first appeared. He verbalized understanding. Discussed calling for any worsening symptoms and discussed return precautions.

## 2018-08-28 NOTE — Telephone Encounter (Signed)
Attempt to screen for COVID-19 prior to appointment at 3:30 today.  No answer. Clarification on "cough" listed in appointment note is needed.  No prior relevant diagnosis in history.  Will continue to follow as appropriate.

## 2018-08-28 NOTE — Telephone Encounter (Signed)
Noted. He would like to virtual visit today at scheduled time to discuss restarting Lisinopril. If deemed fit, pharmacy of choice is [facility]. Visit will be added to calendar.  Reports he has been monitoring BP for the last 7 days ranging from 114/87-141/92 pulse 67.  Taking all medications as scheduled.  His home BP monitor device is 56 years old and plans to replace. He will home isolate for 14 days and is open to follow up in office, after virtual today, if recommended.

## 2018-08-29 NOTE — Telephone Encounter (Signed)
Called and spoke with pt. Pt has been scheduled for lab work and a nurse visit BP check appointment.

## 2018-09-03 ENCOUNTER — Other Ambulatory Visit: Payer: Self-pay | Admitting: Family Medicine

## 2018-09-17 ENCOUNTER — Other Ambulatory Visit: Payer: Self-pay

## 2018-09-17 ENCOUNTER — Other Ambulatory Visit (INDEPENDENT_AMBULATORY_CARE_PROVIDER_SITE_OTHER): Payer: BLUE CROSS/BLUE SHIELD

## 2018-09-17 ENCOUNTER — Ambulatory Visit (INDEPENDENT_AMBULATORY_CARE_PROVIDER_SITE_OTHER): Payer: BLUE CROSS/BLUE SHIELD

## 2018-09-17 VITALS — BP 140/90

## 2018-09-17 DIAGNOSIS — I10 Essential (primary) hypertension: Secondary | ICD-10-CM

## 2018-09-17 LAB — BASIC METABOLIC PANEL
BUN: 9 mg/dL (ref 6–23)
CO2: 27 mEq/L (ref 19–32)
Calcium: 9.2 mg/dL (ref 8.4–10.5)
Chloride: 100 mEq/L (ref 96–112)
Creatinine, Ser: 0.9 mg/dL (ref 0.40–1.50)
GFR: 87.48 mL/min (ref >60.00)
Glucose, Bld: 103 mg/dL — ABNORMAL HIGH (ref 70–99)
Potassium: 3.4 mEq/L — ABNORMAL LOW (ref 3.5–5.1)
Sodium: 140 mEq/L (ref 135–145)

## 2018-09-17 NOTE — Progress Notes (Signed)
Patient here for nurse visit BP check per order from Dr. Caryl Bis   Patient reports compliance with prescribed BP medications: yes  Last dose of BP medication: 4/14//2020 @ 8:30 am  BP Readings from Last 3 Encounters:  09/17/18 140/90  03/07/17 136/76  01/23/17 (!) 140/92   Pulse Readings from Last 3 Encounters:  03/07/17 70  01/23/17 83  09/25/16 76    Per Dr. Caryl Bis: Patient can go home, he had labs today the provider will see what lab results are before any changes to regimen.    Patient verbalized understanding of instructions.   Gordy Councilman, CMA

## 2018-09-22 NOTE — Progress Notes (Addendum)
The patient's blood pressure is not quite at goal.  The easiest thing to do at this time would be increase his fosinopril.  Please find out if he is willing to do this.  If he is willing to do this we will need to recheck his lab work in 7 to 10 days.  Please additionally let him know that his potassium is very minimally low.  He needs to increase potassium rich foods in his diet.

## 2018-09-23 ENCOUNTER — Other Ambulatory Visit: Payer: Self-pay | Admitting: Family Medicine

## 2018-09-23 NOTE — Progress Notes (Signed)
Called pt and informed him of his medication increase pt is willing to do the increase and he will schedule an lab appt after he starts to take medication. Also informed him to eat potassium rich foods.  Stuart Bruce,cma

## 2018-09-23 NOTE — Progress Notes (Signed)
Lmtcb.  Nina,cma

## 2018-09-24 ENCOUNTER — Other Ambulatory Visit: Payer: Self-pay

## 2018-09-24 ENCOUNTER — Telehealth: Payer: Self-pay | Admitting: Family Medicine

## 2018-09-24 MED ORDER — FOSINOPRIL SODIUM 20 MG PO TABS: 20.0000 mg | ORAL_TABLET | Freq: Every day | ORAL | 1 refills | Status: DC

## 2018-09-24 NOTE — Telephone Encounter (Signed)
Copied from [place] [id number]. Topic: Quick Communication - Rx Refill/Question >> Sep 24, 2018  1:55 PM Leward Quan A wrote: Medication: fosinopril (MONOPRIL) 20 MG tablet  Per patient Rx sent to [facility] on 08/28/2018 was not picked up and he is all out of medication  Has the patient contacted their pharmacy? Yes.   (Agent: If no, request that the patient contact the pharmacy for the refill.) (Agent: If yes, when and what did the pharmacy advise?)  Preferred Pharmacy (with phone number or street name): [id number], [place] [phone] (Phone) [phone] (Fax)    Agent: Please be advised that RX refills may take up to 3 business days. We ask that you follow-up with your pharmacy.

## 2018-09-24 NOTE — Telephone Encounter (Signed)
Rx has been sent into pt's pharmacy.  

## 2018-09-24 NOTE — Addendum Note (Signed)
Addended by: Leone Haven on: 09/24/2018 02:04 PM   Modules accepted: Orders

## 2018-09-24 NOTE — Telephone Encounter (Signed)
Called pt and left a VM that Rx has been refilled and sent to [facility].

## 2018-09-24 NOTE — Progress Notes (Signed)
Fosinopril 20 mg sent to patients pharmacy. Labs ordered. Please make sure the patient gets scheduled for labs.

## 2018-09-27 NOTE — Progress Notes (Signed)
Called and spoke with pt, pt picked up new medication and I scheduled him for a lab appt.  Nina,cma

## 2018-10-05 ENCOUNTER — Other Ambulatory Visit: Payer: Self-pay | Admitting: Family Medicine

## 2018-10-07 NOTE — Telephone Encounter (Signed)
Will need to call pt they need an appt for refills

## 2018-10-07 NOTE — Telephone Encounter (Signed)
calle dpt and left a VM to call back pt needs an appt last appt was in 2018

## 2018-10-08 ENCOUNTER — Other Ambulatory Visit: Payer: BLUE CROSS/BLUE SHIELD

## 2019-06-13 ENCOUNTER — Encounter: Payer: Self-pay | Admitting: Family Medicine

## 2019-06-13 ENCOUNTER — Ambulatory Visit (INDEPENDENT_AMBULATORY_CARE_PROVIDER_SITE_OTHER): Payer: 59 | Admitting: Family Medicine

## 2019-06-13 ENCOUNTER — Other Ambulatory Visit: Payer: Self-pay

## 2019-06-13 DIAGNOSIS — F321 Major depressive disorder, single episode, moderate: Secondary | ICD-10-CM | POA: Diagnosis not present

## 2019-06-13 DIAGNOSIS — I10 Essential (primary) hypertension: Secondary | ICD-10-CM

## 2019-06-13 DIAGNOSIS — F1090 Alcohol use, unspecified, uncomplicated: Secondary | ICD-10-CM | POA: Insufficient documentation

## 2019-06-13 DIAGNOSIS — Z7289 Other problems related to lifestyle: Secondary | ICD-10-CM

## 2019-06-13 DIAGNOSIS — M25511 Pain in right shoulder: Secondary | ICD-10-CM | POA: Diagnosis not present

## 2019-06-13 MED ORDER — FOSINOPRIL SODIUM 20 MG PO TABS: 20.0000 mg | ORAL_TABLET | Freq: Every day | ORAL | 1 refills | Status: DC

## 2019-06-13 MED ORDER — AMLODIPINE BESYLATE 10 MG PO TABS: 5.0000 mg | ORAL_TABLET | Freq: Every day | ORAL | 1 refills | Status: DC

## 2019-06-13 MED ORDER — ESCITALOPRAM OXALATE 10 MG PO TABS: 10.0000 mg | ORAL_TABLET | Freq: Every day | ORAL | 1 refills | Status: DC

## 2019-06-13 MED ORDER — DOXAZOSIN MESYLATE 8 MG PO TABS: ORAL_TABLET | ORAL | 0 refills | Status: DC

## 2019-06-13 NOTE — Assessment & Plan Note (Signed)
Related to a fall.  He will see orthopedics and have his MRI as planned.

## 2019-06-13 NOTE — Progress Notes (Signed)
Virtual Visit via video Note  This visit type was conducted due to national recommendations for restrictions regarding the COVID-19 pandemic (e.g. social distancing).  This format is felt to be most appropriate for this patient at this time.  All issues noted in this document were discussed and addressed.  No physical exam was performed (except for noted visual exam findings with Video Visits).   I connected with Stuart Bruce today at  9:00 AM EST by a video enabled telemedicine application and verified that I am speaking with the correct person using two identifiers. Location patient: home Location provider: work Persons participating in the virtual visit: patient, provider  I discussed the limitations, risks, security and privacy concerns of performing an evaluation and management service by telephone and the availability of in person appointments. I also discussed with the patient that there may be a patient responsible charge related to this service. The patient expressed understanding and agreed to proceed.   Reason for visit: follow-up  HPI: HYPERTENSION  Disease Monitoring  Home BP Monitoring 150s over 100s when he is not taking medication.  130/80 when he is taking medication. Chest pain-no.    Dyspnea-no. Medications  Compliance-good taking amlodipine, fosinopril, Cardura.  Edema- some previously though not now  Depression: Patient notes this has steadily gotten worse.  The fourth quarter of 2020 is when this started.  He was doing well working from home though it has gotten old as the year went on.  Less sunshine has not been beneficial either.  He has had a lot of other stuff going on as well.  He has had some episodes where he has had too much to drink.  1 of those he drove home and pulled up into the front yard.  This has caused issues at home.  He also changed jobs in November and its not quite what it was previously.  He is drinking 5-6 alcoholic beverages a day.  He does note  some weight loss.  He is seeing a therapist and working on all of this.  He notes the therapist recommended going somewhere for alcohol treatment.  He has already started to work on cutting down and is down to 4 beverages daily.  No SI.  Fall: He fell in October when his dog pulled him over.  His dog is 60 pounds.  He injured his right shoulder and ended up going to orthopedics.  He has a MRI on Monday as a suspect he tore his rotator cuff.  His pain is not severe at this time though he does continue to have discomfort.   ROS: See pertinent positives and negatives per HPI.  Past Medical History:  Diagnosis Date  . Depression   . Hypertension   . OSA (obstructive sleep apnea)    no CPAP use    Past Surgical History:  Procedure Laterality Date  . HAND SURGERY Right 2005   Cyst on ring finger    Family History  Problem Relation Age of Onset  . Cancer Mother        Breast  . Diabetes Father   . Cancer Father        Renal  . Drug abuse Paternal Aunt   . Colon cancer Neg Hx     SOCIAL HX: Non-smoker   Current Outpatient Medications:  .  amLODipine (NORVASC) 10 MG tablet, Take 0.5 tablets (5 mg total) by mouth daily., Disp: 45 tablet, Rfl: 1 .  Ascorbic Acid (VITAMIN C) 1000 MG tablet,  Take 1,000 mg by mouth daily., Disp: , Rfl:  .  B Complex Vitamins (B COMPLEX 100 PO), Take 1 capsule by mouth daily., Disp: , Rfl:  .  Coenzyme Q10 (CO Q-10) 200 MG CAPS, Take 1 capsule by mouth daily., Disp: , Rfl:  .  doxazosin (CARDURA) 8 MG tablet, TAKE 1 TABLET(8 MG) BY MOUTH DAILY, Disp: 90 tablet, Rfl: 0 .  fosinopril (MONOPRIL) 20 MG tablet, Take 1 tablet (20 mg total) by mouth daily., Disp: 90 tablet, Rfl: 1 .  Maca Root (MACA PO), Take 525 mg by mouth daily., Disp: , Rfl:  .  Milk Thistle 300 MG CAPS, Take 1 capsule by mouth daily., Disp: , Rfl:  .  UNABLE TO FIND, Med Name:Nugenix 3 capsules daily, Disp: , Rfl:  .  escitalopram (LEXAPRO) 10 MG tablet, Take 1 tablet (10 mg total) by  mouth daily., Disp: 90 tablet, Rfl: 1  Current Facility-Administered Medications:  .  0.9 %  sodium chloride infusion, 500 mL, Intravenous, Continuous, Pyrtle, Lajuan Lines, MD  EXAM:  VITALS per patient if applicable:  GENERAL: alert, oriented, appears well and in no acute distress  HEENT: atraumatic, conjunttiva clear, no obvious abnormalities on inspection of external nose and ears  NECK: normal movements of the head and neck  LUNGS: on inspection no signs of respiratory distress, breathing rate appears normal, no obvious gross SOB, gasping or wheezing  CV: no obvious cyanosis  MS: moves all visible extremities without noticeable abnormality  PSYCH/NEURO: pleasant and cooperative, no obvious depression or anxiety, speech and thought processing grossly intact  ASSESSMENT AND PLAN:  Discussed the following assessment and plan:  Benign essential HTN Adequately controlled when he is taking his medications.  I encouraged him to take medication as prescribed.  Refills will be sent to the pharmacy.  He will come in for lab work.  Depression, major, single episode, moderate ([place]) New onset issue.  Lots of life events playing a role.  Discussed that his alcohol intake is likely contributing as well.  Encouraged progressively and slowly decreasing his alcohol intake.  Advised of risk of withdrawal and seizures and death if he were to suddenly discontinue alcohol intake.  Discussed not driving if he is drinking.  Discussed that he could go to an outpatient treatment facility if he would like in the future though he does defer this currently.  We will start him on Lexapro.  He will continue to see his therapist.  We will follow-up in 6 weeks.  Advised of reasons to seek medical attention in the emergency room regarding suicidal ideation.  Discussed contacting us if his depression worsens.  Chronic alcohol use See depression for alcohol use counseling.  Right shoulder pain Related to a fall.  He  will see orthopedics and have his MRI as planned.   Orders Placed This Encounter  Procedures  . Comp Met (CMET)    Standing Status:   Future    Standing Expiration Date:   06/12/2020  . HgB A1c    Standing Status:   Future    Standing Expiration Date:   06/12/2020  . Lipid panel    Standing Status:   Future    Standing Expiration Date:   06/12/2020    Meds ordered this encounter  Medications  . escitalopram (LEXAPRO) 10 MG tablet    Sig: Take 1 tablet (10 mg total) by mouth daily.    Dispense:  90 tablet    Refill:  1  . amLODipine (NORVASC)  10 MG tablet    Sig: Take 0.5 tablets (5 mg total) by mouth daily.    Dispense:  45 tablet    Refill:  1  . doxazosin (CARDURA) 8 MG tablet    Sig: TAKE 1 TABLET(8 MG) BY MOUTH DAILY    Dispense:  90 tablet    Refill:  0  . fosinopril (MONOPRIL) 20 MG tablet    Sig: Take 1 tablet (20 mg total) by mouth daily.    Dispense:  90 tablet    Refill:  1     I discussed the assessment and treatment plan with the patient. The patient was provided an opportunity to ask questions and all were answered. The patient agreed with the plan and demonstrated an understanding of the instructions.   The patient was advised to call back or seek an in-person evaluation if the symptoms worsen or if the condition fails to improve as anticipated.   [name], MD

## 2019-06-13 NOTE — Assessment & Plan Note (Signed)
See depression for alcohol use counseling.

## 2019-06-13 NOTE — Assessment & Plan Note (Signed)
New onset issue.  Lots of life events playing a role.  Discussed that his alcohol intake is likely contributing as well.  Encouraged progressively and slowly decreasing his alcohol intake.  Advised of risk of withdrawal and seizures and death if he were to suddenly discontinue alcohol intake.  Discussed not driving if he is drinking.  Discussed that he could go to an outpatient treatment facility if he would like in the future though he does defer this currently.  We will start him on Lexapro.  He will continue to see his therapist.  We will follow-up in 6 weeks.  Advised of reasons to seek medical attention in the emergency room regarding suicidal ideation.  Discussed contacting us if his depression worsens.

## 2019-06-13 NOTE — Assessment & Plan Note (Signed)
Adequately controlled when he is taking his medications.  I encouraged him to take medication as prescribed.  Refills will be sent to the pharmacy.  He will come in for lab work.

## 2019-07-08 ENCOUNTER — Telehealth: Payer: Self-pay | Admitting: Family Medicine

## 2019-07-08 DIAGNOSIS — Z01818 Encounter for other preprocedural examination: Secondary | ICD-10-CM

## 2019-07-08 DIAGNOSIS — I10 Essential (primary) hypertension: Secondary | ICD-10-CM

## 2019-07-09 ENCOUNTER — Other Ambulatory Visit
Admission: RE | Admit: 2019-07-09 | Discharge: 2019-07-09 | Disposition: A | Discharge status: Home / Self Care | Payer: 59 | Attending: Emergency Medicine | Admitting: Emergency Medicine

## 2019-07-09 ENCOUNTER — Telehealth: Payer: Self-pay

## 2019-07-09 ENCOUNTER — Ambulatory Visit: Payer: Self-pay | Admitting: Family Medicine

## 2019-07-09 DIAGNOSIS — I10 Essential (primary) hypertension: Secondary | ICD-10-CM | POA: Diagnosis not present

## 2019-07-09 DIAGNOSIS — Z01818 Encounter for other preprocedural examination: Secondary | ICD-10-CM | POA: Insufficient documentation

## 2019-07-09 LAB — COMPREHENSIVE METABOLIC PANEL
ALT: 63 U/L — ABNORMAL HIGH (ref 0–44)
AST: 74 U/L — ABNORMAL HIGH (ref 15–41)
Albumin: 4.3 g/dL (ref 3.5–5.0)
Alkaline Phosphatase: 36 U/L — ABNORMAL LOW (ref 38–126)
Anion gap: 13 (ref 5–15)
BUN: 7 mg/dL (ref 6–20)
CO2: 26 mmol/L (ref 22–32)
Calcium: 9.7 mg/dL (ref 8.9–10.3)
Chloride: 96 mmol/L — ABNORMAL LOW (ref 98–111)
Creatinine, Ser: 0.76 mg/dL (ref 0.61–1.24)
GFR calc Af Amer: >60 mL/min (ref >60)
GFR calc non Af Amer: >60 mL/min (ref >60)
Glucose, Bld: 97 mg/dL (ref 70–99)
Potassium: 4 mmol/L (ref 3.5–5.1)
Sodium: 135 mmol/L (ref 135–145)
Total Bilirubin: 1.6 mg/dL — ABNORMAL HIGH (ref 0.3–1.2)
Total Protein: 7.6 g/dL (ref 6.5–8.1)

## 2019-07-09 LAB — LIPID PANEL
Cholesterol: 309 mg/dL — ABNORMAL HIGH (ref 0–200)
HDL: 115 mg/dL (ref >40)
LDL Cholesterol: 179 mg/dL — ABNORMAL HIGH (ref 0–99)
Total CHOL/HDL Ratio: 2.7 RATIO
Triglycerides: 73 mg/dL (ref <150)
VLDL: 15 mg/dL (ref 0–40)

## 2019-07-09 LAB — HEMOGLOBIN A1C
Hgb A1c MFr Bld: 5.9 % — ABNORMAL HIGH (ref 4.8–5.6)
Mean Plasma Glucose: 122.63 mg/dL

## 2019-07-09 LAB — CBC
HCT: 44.1 % (ref 39.0–52.0)
Hemoglobin: 15 g/dL (ref 13.0–17.0)
MCH: 34.2 pg — ABNORMAL HIGH (ref 26.0–34.0)
MCHC: 34 g/dL (ref 30.0–36.0)
MCV: 100.5 fL — ABNORMAL HIGH (ref 80.0–100.0)
Platelets: 171 10*3/uL (ref 150–400)
RBC: 4.39 MIL/uL (ref 4.22–5.81)
RDW: 12 % (ref 11.5–15.5)
WBC: 5.9 10*3/uL (ref 4.0–10.5)
nRBC: 0 % (ref 0.0–0.2)

## 2019-07-09 NOTE — Telephone Encounter (Signed)
He needs to have his labs done for me to be able to sign off on this. I can not accurately comment on risk without knowing his kidney function, a1c, and blood counts.

## 2019-07-09 NOTE — Telephone Encounter (Signed)
I received a call from [place] about the medical clearance and Ailene Ravel informed her that the form was not completed. I called the patient and Informed him that he would need labs and an EKG because the form called for it and he stated he understood. His surgery is scheduled for 07/10/2019.  I I then called to [facility] and informed her that the form was not completed because the patient needed and appointment for his EKG and labs per Dr. Caryl Bis, She stated that she did not need nothing she had the patient cleared through anesthesia and she hung up.  I informed the provider and I called to tell the patient that the provider stated he at least needed the labs he asked the provider to order his labs at [facility].  Tyresa Prindiville,cma

## 2019-07-10 NOTE — Telephone Encounter (Signed)
Patient wanted labs ordered at [facility].  Provider was notified and labs were ordered.  Jelisha Weed,cma

## 2019-07-17 ENCOUNTER — Other Ambulatory Visit: Payer: Self-pay | Admitting: Family Medicine

## 2019-07-17 DIAGNOSIS — R7989 Other specified abnormal findings of blood chemistry: Secondary | ICD-10-CM

## 2019-07-17 DIAGNOSIS — R945 Abnormal results of liver function studies: Secondary | ICD-10-CM

## 2019-07-23 DIAGNOSIS — M75120 Complete rotator cuff tear or rupture of unspecified shoulder, not specified as traumatic: Secondary | ICD-10-CM | POA: Insufficient documentation

## 2019-08-05 ENCOUNTER — Other Ambulatory Visit: Payer: Self-pay

## 2019-08-05 ENCOUNTER — Other Ambulatory Visit (INDEPENDENT_AMBULATORY_CARE_PROVIDER_SITE_OTHER): Payer: 59

## 2019-08-05 DIAGNOSIS — R7989 Other specified abnormal findings of blood chemistry: Secondary | ICD-10-CM

## 2019-08-05 DIAGNOSIS — R945 Abnormal results of liver function studies: Secondary | ICD-10-CM | POA: Diagnosis not present

## 2019-08-05 LAB — HEPATIC FUNCTION PANEL
ALT: 21 U/L (ref 0–53)
AST: 16 U/L (ref 0–37)
Albumin: 4.1 g/dL (ref 3.5–5.2)
Alkaline Phosphatase: 37 U/L — ABNORMAL LOW (ref 39–117)
Bilirubin, Direct: 0.1 mg/dL (ref 0.0–0.3)
Total Bilirubin: 0.6 mg/dL (ref 0.2–1.2)
Total Protein: 6.7 g/dL (ref 6.0–8.3)

## 2019-09-05 ENCOUNTER — Ambulatory Visit: Payer: 59 | Attending: Internal Medicine

## 2019-09-05 DIAGNOSIS — Z23 Encounter for immunization: Secondary | ICD-10-CM

## 2019-09-05 NOTE — Progress Notes (Signed)
   Covid-19 Vaccination Clinic  Name:  Stuart Bruce    MRN: [id number] DOB: 1960-07-16  09/05/2019  [name] was observed post Covid-19 immunization for 15 minutes without incident. He was provided with Vaccine Information Sheet and instruction to access the V-Safe system.   [name] was instructed to call 911 with any severe reactions post vaccine: Marland Kitchen Difficulty breathing  . Swelling of face and throat  . A fast heartbeat  . A bad rash all over body  . Dizziness and weakness   Immunizations Administered    Name Date Dose VIS Date Route   Pfizer COVID-19 Vaccine 09/05/2019 11:16 AM 0.3 mL 05/16/2019 Intramuscular   Manufacturer: [place]   Lot: [id number]   [place]: [id number]

## 2019-09-30 ENCOUNTER — Ambulatory Visit: Payer: 59 | Attending: Internal Medicine

## 2019-09-30 DIAGNOSIS — Z23 Encounter for immunization: Secondary | ICD-10-CM

## 2019-09-30 NOTE — Progress Notes (Signed)
   Covid-19 Vaccination Clinic  Name:  Stuart Bruce    MRN: [id number] DOB: 1961/02/05  09/30/2019  [name] was observed post Covid-19 immunization for 15 minutes without incident. He was provided with Vaccine Information Sheet and instruction to access the V-Safe system.   [name] was instructed to call 911 with any severe reactions post vaccine: Marland Kitchen Difficulty breathing  . Swelling of face and throat  . A fast heartbeat  . A bad rash all over body  . Dizziness and weakness   Immunizations Administered    Name Date Dose VIS Date Route   Pfizer COVID-19 Vaccine 09/30/2019 12:18 PM 0.3 mL 07/30/2018 Intramuscular   Manufacturer: [place]   Lot: [id number]   [place]: [id number]

## 2019-10-03 ENCOUNTER — Other Ambulatory Visit: Payer: Self-pay | Admitting: Family Medicine

## 2019-10-08 ENCOUNTER — Other Ambulatory Visit: Payer: Self-pay

## 2019-10-08 ENCOUNTER — Ambulatory Visit (INDEPENDENT_AMBULATORY_CARE_PROVIDER_SITE_OTHER): Payer: 59 | Admitting: Family Medicine

## 2019-10-08 ENCOUNTER — Encounter: Payer: Self-pay | Admitting: Family Medicine

## 2019-10-08 VITALS — BP 120/80 | HR 77 | Temp 98.4°F | Ht 67.0 in | Wt 220.2 lb

## 2019-10-08 DIAGNOSIS — F321 Major depressive disorder, single episode, moderate: Secondary | ICD-10-CM | POA: Diagnosis not present

## 2019-10-08 DIAGNOSIS — F1090 Alcohol use, unspecified, uncomplicated: Secondary | ICD-10-CM

## 2019-10-08 DIAGNOSIS — I10 Essential (primary) hypertension: Secondary | ICD-10-CM

## 2019-10-08 DIAGNOSIS — R7989 Other specified abnormal findings of blood chemistry: Secondary | ICD-10-CM

## 2019-10-08 DIAGNOSIS — D7589 Other specified diseases of blood and blood-forming organs: Secondary | ICD-10-CM | POA: Insufficient documentation

## 2019-10-08 DIAGNOSIS — Z7289 Other problems related to lifestyle: Secondary | ICD-10-CM

## 2019-10-08 DIAGNOSIS — R140 Abdominal distension (gaseous): Secondary | ICD-10-CM

## 2019-10-08 DIAGNOSIS — M25511 Pain in right shoulder: Secondary | ICD-10-CM

## 2019-10-08 LAB — COMPREHENSIVE METABOLIC PANEL
ALT: 18 U/L (ref 0–53)
AST: 17 U/L (ref 0–37)
Albumin: 4.3 g/dL (ref 3.5–5.2)
Alkaline Phosphatase: 38 U/L — ABNORMAL LOW (ref 39–117)
BUN: 12 mg/dL (ref 6–23)
CO2: 28 mEq/L (ref 19–32)
Calcium: 9.3 mg/dL (ref 8.4–10.5)
Chloride: 101 mEq/L (ref 96–112)
Creatinine, Ser: 0.75 mg/dL (ref 0.40–1.50)
GFR: 107.55 mL/min (ref >60.00)
Glucose, Bld: 95 mg/dL (ref 70–99)
Potassium: 3.9 mEq/L (ref 3.5–5.1)
Sodium: 138 mEq/L (ref 135–145)
Total Bilirubin: 0.7 mg/dL (ref 0.2–1.2)
Total Protein: 6.8 g/dL (ref 6.0–8.3)

## 2019-10-08 LAB — CBC
HCT: 41.6 % (ref 39.0–52.0)
Hemoglobin: 14.1 g/dL (ref 13.0–17.0)
MCHC: 34 g/dL (ref 30.0–36.0)
MCV: 95.9 fl (ref 78.0–100.0)
Platelets: 251 10*3/uL (ref 150.0–400.0)
RBC: 4.34 Mil/uL (ref 4.22–5.81)
RDW: 13.1 % (ref 11.5–15.5)
WBC: 7.3 10*3/uL (ref 4.0–10.5)

## 2019-10-08 LAB — FOLATE: Folate: 8.9 ng/mL (ref >5.9)

## 2019-10-08 LAB — VITAMIN B12: Vitamin B-12: 351 pg/mL (ref 211–911)

## 2019-10-08 NOTE — Assessment & Plan Note (Signed)
Improved. Continue Lexapro.

## 2019-10-08 NOTE — Progress Notes (Signed)
Stuart Rumps, MD Phone: [phone]  Stuart Bruce is a 59 y.o. male who presents today for f/u.  HYPERTENSION  Disease Monitoring  Home BP Monitoring 120s/70s Chest pain- no    Dyspnea- no Medications  Compliance-  Taking amlodipine, cardura, fosinopril.  Edema- rare  Elevated LFTs: These normalized after he quit drinking alcohol.  Last alcoholic beverage was prior to his surgery for his shoulder.  No right upper quadrant pain.  Weight gain: Patient notes he has been less active than previous after his surgery.  He is going to increase his activity as it is now warmer.  Patient notes his typical weight gain is around his abdomen. His abdomen is enlarged compared to the rest of his body.   Right shoulder pain: He underwent surgical intervention for this.  He is recovering well.  He is doing physical therapy.  Depression: He denies depressive symptoms.  Continues on Lexapro as this is helping.  He has a weird sensation of optimism which he notes is odd for him.  No SI.    Social History   Tobacco Use  Smoking Status Never Smoker  Smokeless Tobacco Never Used     ROS see history of present illness  Objective  Physical Exam Vitals:   10/08/19 1436  BP: 120/80  Pulse: 77  Temp: 98.4 F (36.9 C)  SpO2: 98%    BP Readings from Last 3 Encounters:  10/08/19 120/80  09/17/18 140/90  03/07/17 136/76   Wt Readings from Last 3 Encounters:  10/08/19 220 lb 3.2 oz (99.9 kg)  06/13/19 195 lb (88.5 kg)  01/23/17 204 lb 9.6 oz (92.8 kg)    Physical Exam Constitutional:      General: He is not in acute distress.    Appearance: He is not diaphoretic.  Cardiovascular:     Rate and Rhythm: Normal rate and regular rhythm.     Heart sounds: Normal heart sounds.  Pulmonary:     Effort: Pulmonary effort is normal.     Breath sounds: Normal breath sounds.  Abdominal:     General: Bowel sounds are normal. There is distension.     Palpations: Abdomen is soft.   Tenderness: There is no abdominal tenderness. There is no guarding or rebound.     Comments: It does not seem to be very much fatty tissue on the outside of his abdominal muscles, his abdomen is enlarged compared to his extremities  Skin:    General: Skin is warm and dry.  Neurological:     Mental Status: He is alert.      Assessment/Plan: Please see individual problem list.  Benign essential HTN Well-controlled.  Continue current regimen.  Elevated LFTs Normalized on most recent check.  We will recheck today.  He does have some apparent abdominal distention versus weight gain.  Will obtain an ultrasound of his abdomen to evaluate for possible ascites.  He will work on exercise for the weight gain.  Depression, major, single episode, moderate (H[facility]) Improved.  Continue Lexapro.  Right shoulder pain Doing well after surgery.  He will continue to see his orthopedist.  Chronic alcohol use No alcohol in the last 91 days.  I congratulated him on this.  He will continue to abstain from alcohol.   Orders Placed This Encounter  Procedures  . US Abdomen Complete    Standing Status:   Future    Standing Expiration Date:   12/07/2020    Order Specific Question:   Reason for Exam (SYMPTOM  OR DIAGNOSIS REQUIRED)    Answer:   abdominal distension, history of elevated LFTs and alcohol use    Order Specific Question:   Preferred imaging location?    Answer:   [facility]  . Comp Met (CMET)  . CBC  . B12  . Folate    No orders of the defined types were placed in this encounter.   This visit occurred during the SARS-CoV-2 public health emergency.  Safety protocols were in place, including screening questions prior to the visit, additional usage of staff PPE, and extensive cleaning of exam room while observing appropriate contact time as indicated for disinfecting solutions.    Stuart Rumps, MD [place]

## 2019-10-08 NOTE — Patient Instructions (Signed)
Nice to see you. We will get additional labs and contact you with the result.  Please continue to avoid alcohol.

## 2019-10-08 NOTE — Assessment & Plan Note (Signed)
Doing well after surgery.  He will continue to see his orthopedist.

## 2019-10-08 NOTE — Assessment & Plan Note (Signed)
Well-controlled.  Continue current regimen. 

## 2019-10-08 NOTE — Assessment & Plan Note (Signed)
No alcohol in the last 91 days.  I congratulated him on this.  He will continue to abstain from alcohol.

## 2019-10-08 NOTE — Assessment & Plan Note (Signed)
Normalized on most recent check.  We will recheck today.  He does have some apparent abdominal distention versus weight gain.  Will obtain an ultrasound of his abdomen to evaluate for possible ascites.  He will work on exercise for the weight gain.

## 2019-10-15 ENCOUNTER — Ambulatory Visit
Admission: RE | Admit: 2019-10-15 | Discharge: 2019-10-15 | Disposition: A | Discharge status: Home / Self Care | Payer: 59 | Source: Ambulatory Visit | Attending: Family Medicine | Admitting: Family Medicine

## 2019-10-15 ENCOUNTER — Other Ambulatory Visit: Payer: Self-pay

## 2019-10-15 DIAGNOSIS — R140 Abdominal distension (gaseous): Secondary | ICD-10-CM | POA: Diagnosis present

## 2019-10-16 ENCOUNTER — Encounter: Payer: Self-pay | Admitting: Family Medicine

## 2019-12-10 ENCOUNTER — Ambulatory Visit: Payer: 59 | Admitting: Family Medicine

## 2019-12-24 ENCOUNTER — Ambulatory Visit: Payer: Self-pay | Admitting: Family Medicine

## 2020-01-09 ENCOUNTER — Other Ambulatory Visit: Payer: Self-pay

## 2020-01-09 ENCOUNTER — Ambulatory Visit (INDEPENDENT_AMBULATORY_CARE_PROVIDER_SITE_OTHER): Payer: 59 | Admitting: Family Medicine

## 2020-01-09 DIAGNOSIS — I10 Essential (primary) hypertension: Secondary | ICD-10-CM

## 2020-01-09 DIAGNOSIS — E66811 Obesity, class 1: Secondary | ICD-10-CM | POA: Insufficient documentation

## 2020-01-09 DIAGNOSIS — F1090 Alcohol use, unspecified, uncomplicated: Secondary | ICD-10-CM

## 2020-01-09 DIAGNOSIS — E669 Obesity, unspecified: Secondary | ICD-10-CM

## 2020-01-09 DIAGNOSIS — Z7289 Other problems related to lifestyle: Secondary | ICD-10-CM | POA: Diagnosis not present

## 2020-01-09 NOTE — Assessment & Plan Note (Signed)
Encouraged diet and exercise.  Advised to monitor his fat intake with a keto diet and not overdo it.

## 2020-01-09 NOTE — Patient Instructions (Signed)
Nice to see you. Please start back on the cardura.  Please continue to monitor your BP. Please send me some readings in 2 weeks.

## 2020-01-09 NOTE — Progress Notes (Signed)
  [name], MD Phone: [phone]  VEDANT SHEHADEH is a 59 y.o. male who presents today for follow-up.  Hypertension: Typically 135/85 recently since has been off of his Cardura.  Taking amlodipine and lisinopril.  He ran out of the Cardura when his insurance coverage stopped.  He is currently on his wife's insurance and will be able to pick up the Cardura soon.  No chest pain or shortness of breath.  No edema.  Obesity: Weight has trended down slightly.  He started a keto diet earlier this week.  He is going to start exercising as well.  Alcohol use: Patient joined AA.  He has cut alcohol out.  He continues to work on abstaining from alcohol.  Social History   Tobacco Use  Smoking Status Never Smoker  Smokeless Tobacco Never Used     ROS see history of present illness  Objective  Physical Exam Vitals:   01/09/20 1519 01/09/20 1522  BP: (!) 152/98 130/82  Pulse: 89   Temp: 99 F (37.2 C)   SpO2: 98%     BP Readings from Last 3 Encounters:  01/09/20 130/82  10/08/19 120/80  09/17/18 140/90   Wt Readings from Last 3 Encounters:  01/09/20 216 lb 12.8 oz (98.3 kg)  10/08/19 220 lb 3.2 oz (99.9 kg)  06/13/19 195 lb (88.5 kg)    Physical Exam Constitutional:      General: He is not in acute distress.    Appearance: He is not diaphoretic.  Cardiovascular:     Rate and Rhythm: Normal rate and regular rhythm.     Heart sounds: Normal heart sounds.  Pulmonary:     Effort: Pulmonary effort is normal.     Breath sounds: Normal breath sounds.  Musculoskeletal:     Right lower leg: No edema.     Left lower leg: No edema.  Skin:    General: Skin is warm and dry.  Neurological:     Mental Status: He is alert.      Assessment/Plan: Please see individual problem list.  Benign essential HTN Slightly above goal.  Encouraged him to pick up his Cardura and start taking that again.  He will continue his current regimen.  He will send [place] some readings in a couple of  weeks to see what his blood pressure is on all 3 medications.  Chronic alcohol use Congratulated on abstaining from alcohol.  Encouraged continued AA attendance.  Obesity (BMI 30.0-34.9) Encouraged diet and exercise.  Advised to monitor his fat intake with a keto diet and not overdo it.   No orders of the defined types were placed in this encounter.   No orders of the defined types were placed in this encounter.   This visit occurred during the SARS-CoV-2 public health emergency.  Safety protocols were in place, including screening questions prior to the visit, additional usage of staff PPE, and extensive cleaning of exam room while observing appropriate contact time as indicated for disinfecting solutions.    [name], MD [place]

## 2020-01-09 NOTE — Assessment & Plan Note (Signed)
Congratulated on abstaining from alcohol.  Encouraged continued AA attendance.

## 2020-01-09 NOTE — Assessment & Plan Note (Signed)
Slightly above goal.  Encouraged him to pick up his Cardura and start taking that again.  He will continue his current regimen.  He will send [place] some readings in a couple of weeks to see what his blood pressure is on all 3 medications.

## 2020-02-11 ENCOUNTER — Encounter: Payer: Self-pay | Admitting: Family Medicine

## 2020-04-14 ENCOUNTER — Ambulatory Visit: Payer: 59 | Admitting: Family Medicine

## 2020-04-26 ENCOUNTER — Ambulatory Visit: Payer: 59 | Admitting: Family Medicine

## 2020-04-26 DIAGNOSIS — Z0289 Encounter for other administrative examinations: Secondary | ICD-10-CM

## 2020-05-05 DIAGNOSIS — Z20822 Contact with and (suspected) exposure to covid-19: Secondary | ICD-10-CM | POA: Diagnosis not present

## 2020-05-05 DIAGNOSIS — Z03818 Encounter for observation for suspected exposure to other biological agents ruled out: Secondary | ICD-10-CM | POA: Diagnosis not present

## 2020-05-05 DIAGNOSIS — U071 COVID-19: Secondary | ICD-10-CM | POA: Diagnosis not present

## 2020-05-17 DIAGNOSIS — Z20822 Contact with and (suspected) exposure to covid-19: Secondary | ICD-10-CM | POA: Diagnosis not present

## 2020-05-20 ENCOUNTER — Telehealth: Payer: Self-pay | Admitting: Family Medicine

## 2020-05-20 NOTE — Telephone Encounter (Signed)
Patient tested positive for covid on 05/05/2020. He still has the headache, fatigue , and leg cramps. Patient tested positive again 05/17/2020. He needs information so he can go back to work.

## 2020-05-20 NOTE — Telephone Encounter (Signed)
Patient tested positive for covid on 05/05/2020. He still has the headache, fatigue , and leg cramps. Patient tested positive again 05/17/2020. He needs information so he can go back to work. Betzaida Cremeens,cma

## 2020-05-20 NOTE — Telephone Encounter (Signed)
I called and spoke with the patient and he stated he tested positive on December 1 and  he retook the covid test so he could go back to work and it came up positive again, he did schedule a virtual visit with you and I explained for him to remain quarantined and he understood he is scheduled for 12/29 as a virtual visit.  I did inform him that he can get a letter for work when needed.   Jazlin Tapscott,cma

## 2020-05-20 NOTE — Telephone Encounter (Signed)
He really needs to complete a virtual visit to determine if he can go back to work. Why did he get a second covid test?

## 2020-05-21 NOTE — Telephone Encounter (Signed)
Lvm for the patient to call back.  Cassie Shedlock,cma  

## 2020-05-21 NOTE — Telephone Encounter (Signed)
Noted. The COVID test can come up positive for up to 90 days after the initial infection. Please find out what symptoms he had initially and what symptoms he currently has. Did he complete the quarantine as outlined by the health department when they contacted him?

## 2020-05-24 NOTE — Telephone Encounter (Signed)
Pt called back returning your call Pt needs a work note for being out

## 2020-05-25 NOTE — Telephone Encounter (Signed)
Noted. When does he need the work note by? He should be ok to return to work at this time as he is more than 10 days after the onset of symptoms and has had more than 24 hours of improved symptoms.

## 2020-05-25 NOTE — Telephone Encounter (Signed)
Patient returned office phone call, he has questions about testing positive again.

## 2020-05-25 NOTE — Telephone Encounter (Signed)
I called and spoke with the patient. I informed the patient that he would come up positive for up to 90 days after the initial infection. He initially had symptoms of nose congestions, body aches, cough and 3-4 days of muscle pain. His current symptoms is no fever a headache off and on and his legs ache really bad.  He stated he needed a work note that the provider stated he would give him on 05/31/2020. He has a virtual visit on 06/02/2020.  Jermiya Reichl,cma

## 2020-05-25 NOTE — Telephone Encounter (Signed)
I called and LVM for the patient to call back to tell us when he was needing the note to go back to work.  I also stated he could answer on mychart.  Erisha Paugh,cma

## 2020-05-27 NOTE — Telephone Encounter (Signed)
I called the patient and asked when he was planning on going back to work and he stated he wanted to return on Monday 05/31/2020.  Aariv Medlock,cma

## 2020-05-29 NOTE — Telephone Encounter (Signed)
The patient is ok to return to work on 05/31/20. Work note completed. Message sent to patient.

## 2020-06-02 ENCOUNTER — Telehealth: Payer: 59 | Admitting: Family Medicine

## 2020-06-09 DIAGNOSIS — F1020 Alcohol dependence, uncomplicated: Secondary | ICD-10-CM | POA: Diagnosis not present

## 2020-06-16 ENCOUNTER — Other Ambulatory Visit: Payer: Self-pay

## 2020-06-16 ENCOUNTER — Encounter: Payer: Self-pay | Admitting: Family Medicine

## 2020-06-16 ENCOUNTER — Ambulatory Visit: Payer: 59 | Admitting: Family Medicine

## 2020-06-16 VITALS — BP 100/60 | HR 109 | Temp 98.2°F | Ht 67.0 in | Wt 203.2 lb

## 2020-06-16 DIAGNOSIS — F1010 Alcohol abuse, uncomplicated: Secondary | ICD-10-CM | POA: Diagnosis not present

## 2020-06-16 DIAGNOSIS — I10 Essential (primary) hypertension: Secondary | ICD-10-CM | POA: Diagnosis not present

## 2020-06-16 DIAGNOSIS — F321 Major depressive disorder, single episode, moderate: Secondary | ICD-10-CM | POA: Diagnosis not present

## 2020-06-16 DIAGNOSIS — R2689 Other abnormalities of gait and mobility: Secondary | ICD-10-CM

## 2020-06-16 DIAGNOSIS — R29898 Other symptoms and signs involving the musculoskeletal system: Secondary | ICD-10-CM | POA: Diagnosis not present

## 2020-06-16 DIAGNOSIS — R270 Ataxia, unspecified: Secondary | ICD-10-CM | POA: Diagnosis not present

## 2020-06-16 DIAGNOSIS — Z7289 Other problems related to lifestyle: Secondary | ICD-10-CM

## 2020-06-16 DIAGNOSIS — F1090 Alcohol use, unspecified, uncomplicated: Secondary | ICD-10-CM

## 2020-06-16 NOTE — Patient Instructions (Signed)
Nice to see you. We will stop your amlodipine.  Please monitor your blood pressure with discontinuing this medication. Please start your Lexapro back. We will get lab work today. We will get an MRI scheduled. I will see you back in 2 weeks though if you have any sudden worsening symptoms please go to the emergency room.

## 2020-06-16 NOTE — Progress Notes (Signed)
[name], MD Phone: [phone]  Stuart Bruce is a 60 y.o. male who presents today for f/u.  Leg weakness, balance difficulty, memory change: Patient notes symptoms have been going on for some time now.  Leg weakness has been going on for several months at least.  He has had several falls with this.  During 1 of these he did hit his head though had no loss of consciousness.  On both occasions with recent falls he has fallen down at least 6-8 steps with no significant injury.  He wanted to give some background information as he suffered the loss of his job over the summer and then got back into binge drinking.  He subsequently went into AA though had a dust up with his sponsor and has been out of AA recently.  He notes he got back into drinking after getting out of AA.  He notes his balance was off even when he was sober.  He notes his mother passed away and he had been drinking that day.  There was an instance where he thought he had told everyone he went to get food though his children went out to get the food and his wife noted he could drive around if he needed and he went to [facility] and one of his buddies bought him a drink.  He notes he moved quickly through those drinks and ended up getting pulled over as his tags were expired.  He failed a field sobriety test and ended up being arrested for DWI.  He notes that its been quite difficult.  He has been drinking about a half a pint of liquor per day.  He also had COVID in December and notes the fatigue was the worst part of that.  He does note some depression and anxiety as he has not been taking the Lexapro as he should.  No SI.  He also notes intermittent lightheadedness as his blood pressure occasionally goes to 80/60.  They report memory issues and notes he repeats himself a lot.  He has not noticed any work issues at his part-time job at [employer].  Social History   Tobacco Use  Smoking Status Never Smoker  Smokeless Tobacco Never  Used    Current Outpatient Medications on File Prior to Visit  Medication Sig Dispense Refill  . Ascorbic Acid (VITAMIN C) 1000 MG tablet Take 1,000 mg by mouth daily.    . B Complex Vitamins (B COMPLEX 100 PO) Take 1 capsule by mouth daily.    . Coenzyme Q10 (CO Q-10) 200 MG CAPS Take 1 capsule by mouth daily.    Marland Kitchen doxazosin (CARDURA) 8 MG tablet TAKE 1 TABLET(8 MG) BY MOUTH DAILY 90 tablet 0  . escitalopram (LEXAPRO) 10 MG tablet Take 1 tablet (10 mg total) by mouth daily. 90 tablet 1  . fosinopril (MONOPRIL) 20 MG tablet Take 1 tablet (20 mg total) by mouth daily. 90 tablet 1  . Maca Root (MACA PO) Take 525 mg by mouth daily.    . Milk Thistle 300 MG CAPS Take 1 capsule by mouth daily.    Marland Kitchen UNABLE TO FIND Med Name:Nugenix 3 capsules daily     Current Facility-Administered Medications on File Prior to Visit  Medication Dose Route Frequency Provider Last Rate Last Admin  . 0.9 %  sodium chloride infusion  500 mL Intravenous Continuous Pyrtle, Lajuan Lines, MD         ROS see history of present illness  Objective  Physical Exam Vitals:   06/16/20 1521  BP: 100/60  Pulse: (!) 109  Temp: 98.2 F (36.8 C)  SpO2: 98%   Laying blood pressure 93/60 pulse 92 Sitting blood pressure 85/53 pulse 99 Standing blood pressure 76/44 pulse 103  BP Readings from Last 3 Encounters:  06/16/20 100/60  01/09/20 130/82  10/08/19 120/80   Wt Readings from Last 3 Encounters:  06/16/20 203 lb 3.2 oz (92.2 kg)  01/09/20 216 lb 12.8 oz (98.3 kg)  10/08/19 220 lb 3.2 oz (99.9 kg)    Physical Exam Constitutional:      General: He is not in acute distress.    Appearance: He is not diaphoretic.  HENT:     Head: Normocephalic and atraumatic.  Cardiovascular:     Rate and Rhythm: Normal rate and regular rhythm.     Heart sounds: Normal heart sounds.  Pulmonary:     Effort: Pulmonary effort is normal.     Breath sounds: Normal breath sounds.  Musculoskeletal:        General: No edema.     Right  lower leg: No edema.     Left lower leg: No edema.  Skin:    General: Skin is warm and dry.  Neurological:     Mental Status: He is alert.     Comments: CN 2-12 intact, 5/5 strength in bilateral biceps, triceps, grip, quads, hamstrings, plantar and dorsiflexion, sensation to light touch intact in bilateral UE and LE, normal gait, 2+ patellar reflexes, negative Romberg, no pronator drift, normal finger-to-nose, normal rapid alternating movements, normal heel-to-shin      Assessment/Plan: Please see individual problem list.  Problem List Items Addressed This Visit    Ataxia - Primary    Possibly related to chronic alcohol use.  Neurologically intact today.  We will get a MRI to evaluate further.  We will also check labs as outlined below.  I encouraged him to start on B complex supplement and folate.      Relevant Orders   CBC (Completed)   Comp Met (CMET) (Completed)   TSH (Completed)   MR Brain Wo Contrast   Benign essential HTN    Blood pressure has been low on occasion.  He was orthostatic today.  Advised to discontinue amlodipine.  He will continue to monitor his blood pressure at home.      Chronic alcohol use    Encouraged alcohol cessation with gradual tapering.  Discussed risk of withdrawal and death with sudden cessation of alcohol.  Offered to provide him with the phone number for an alcohol treatment center though he notes he will have court mandated evaluation for this and thinks they will provide him with some resources.      Depression, major, single episode, moderate ([place])    Continued issues with depression.  Encouraged him to restart his Lexapro.  Depression could be contributing to memory issues.      Leg weakness, bilateral    Labs and MRI ordered.      Relevant Orders   CBC (Completed)   Comp Met (CMET) (Completed)   TSH (Completed)    Other Visit Diagnoses    Balance problem       Relevant Orders   B12 (Completed)   Vitamin B1   Alcohol abuse        Relevant Orders   Vitamin B1       This visit occurred during the SARS-CoV-2 public health emergency.  Safety protocols were in place, including screening  questions prior to the visit, additional usage of staff PPE, and extensive cleaning of exam room while observing appropriate contact time as indicated for disinfecting solutions.    [name], MD [place]

## 2020-06-17 LAB — COMPREHENSIVE METABOLIC PANEL
ALT: 64 U/L — ABNORMAL HIGH (ref 0–53)
AST: 89 U/L — ABNORMAL HIGH (ref 0–37)
Albumin: 4.3 g/dL (ref 3.5–5.2)
Alkaline Phosphatase: 37 U/L — ABNORMAL LOW (ref 39–117)
BUN: 12 mg/dL (ref 6–23)
CO2: 27 mEq/L (ref 19–32)
Calcium: 9.2 mg/dL (ref 8.4–10.5)
Chloride: 96 mEq/L (ref 96–112)
Creatinine, Ser: 0.95 mg/dL (ref 0.40–1.50)
GFR: 89.07 mL/min (ref >60.00)
Glucose, Bld: 118 mg/dL — ABNORMAL HIGH (ref 70–99)
Potassium: 3.5 mEq/L (ref 3.5–5.1)
Sodium: 134 mEq/L — ABNORMAL LOW (ref 135–145)
Total Bilirubin: 1.1 mg/dL (ref 0.2–1.2)
Total Protein: 6.7 g/dL (ref 6.0–8.3)

## 2020-06-17 LAB — CBC
HCT: 42 % (ref 39.0–52.0)
Hemoglobin: 14.1 g/dL (ref 13.0–17.0)
MCHC: 33.6 g/dL (ref 30.0–36.0)
MCV: 102.6 fl — ABNORMAL HIGH (ref 78.0–100.0)
Platelets: 174 10*3/uL (ref 150.0–400.0)
RBC: 4.09 Mil/uL — ABNORMAL LOW (ref 4.22–5.81)
RDW: 13.8 % (ref 11.5–15.5)
WBC: 3.5 10*3/uL — ABNORMAL LOW (ref 4.0–10.5)

## 2020-06-17 LAB — VITAMIN B12: Vitamin B-12: 425 pg/mL (ref 211–911)

## 2020-06-17 LAB — TSH: TSH: 1.1 u[IU]/mL (ref 0.35–4.50)

## 2020-06-18 DIAGNOSIS — R29898 Other symptoms and signs involving the musculoskeletal system: Secondary | ICD-10-CM | POA: Insufficient documentation

## 2020-06-18 DIAGNOSIS — R270 Ataxia, unspecified: Secondary | ICD-10-CM | POA: Insufficient documentation

## 2020-06-18 NOTE — Assessment & Plan Note (Signed)
Blood pressure has been low on occasion.  He was orthostatic today.  Advised to discontinue amlodipine.  He will continue to monitor his blood pressure at home.

## 2020-06-18 NOTE — Assessment & Plan Note (Signed)
Possibly related to chronic alcohol use.  Neurologically intact today.  We will get a MRI to evaluate further.  We will also check labs as outlined below.  I encouraged him to start on B complex supplement and folate.

## 2020-06-18 NOTE — Assessment & Plan Note (Signed)
Labs and MRI ordered.

## 2020-06-18 NOTE — Assessment & Plan Note (Addendum)
Encouraged alcohol cessation with gradual tapering.  Discussed risk of withdrawal and death with sudden cessation of alcohol.  Offered to provide him with the phone number for an alcohol treatment center though he notes he will have court mandated evaluation for this and thinks they will provide him with some resources.

## 2020-06-18 NOTE — Assessment & Plan Note (Signed)
Continued issues with depression.  Encouraged him to restart his Lexapro.  Depression could be contributing to memory issues.

## 2020-06-19 LAB — VITAMIN B1: Vitamin B1 (Thiamine): 7 nmol/L — ABNORMAL LOW (ref 8–30)

## 2020-06-22 NOTE — Progress Notes (Signed)
He said its he takes 100 mg (B1 vitamin)

## 2020-06-25 ENCOUNTER — Ambulatory Visit: Payer: 59

## 2020-06-30 ENCOUNTER — Other Ambulatory Visit: Payer: Self-pay

## 2020-06-30 ENCOUNTER — Ambulatory Visit
Admission: RE | Admit: 2020-06-30 | Discharge: 2020-06-30 | Disposition: A | Discharge status: Home / Self Care | Payer: 59 | Source: Ambulatory Visit | Attending: Family Medicine | Admitting: Family Medicine

## 2020-06-30 DIAGNOSIS — I6782 Cerebral ischemia: Secondary | ICD-10-CM | POA: Diagnosis not present

## 2020-06-30 DIAGNOSIS — R270 Ataxia, unspecified: Secondary | ICD-10-CM | POA: Insufficient documentation

## 2020-06-30 DIAGNOSIS — G319 Degenerative disease of nervous system, unspecified: Secondary | ICD-10-CM | POA: Diagnosis not present

## 2020-06-30 DIAGNOSIS — I6381 Other cerebral infarction due to occlusion or stenosis of small artery: Secondary | ICD-10-CM | POA: Diagnosis not present

## 2020-07-01 ENCOUNTER — Telehealth: Payer: Self-pay | Admitting: Family Medicine

## 2020-07-01 DIAGNOSIS — F1010 Alcohol abuse, uncomplicated: Secondary | ICD-10-CM

## 2020-07-01 NOTE — Telephone Encounter (Signed)
Patient wants to get labs prior to appointment on 07-22-20 need order

## 2020-07-01 NOTE — Telephone Encounter (Signed)
Called and LVM for the patient informing him that the labs are ordered and he needs to call back and schedule a lab appointment.  Breckyn Troyer,cma

## 2020-07-02 ENCOUNTER — Ambulatory Visit: Payer: 59 | Admitting: Family Medicine

## 2020-07-02 ENCOUNTER — Encounter: Payer: Self-pay | Admitting: Neurology

## 2020-07-02 ENCOUNTER — Other Ambulatory Visit: Payer: Self-pay | Admitting: Family Medicine

## 2020-07-02 DIAGNOSIS — G319 Degenerative disease of nervous system, unspecified: Secondary | ICD-10-CM

## 2020-07-02 DIAGNOSIS — R270 Ataxia, unspecified: Secondary | ICD-10-CM

## 2020-07-13 NOTE — Telephone Encounter (Signed)
Called and LVM for the patient to call back to schedule a lab appointment

## 2020-07-14 NOTE — Telephone Encounter (Signed)
LVM for the patient to call and schedule a lab appointment. Waymon Laser,cma

## 2020-07-15 NOTE — Telephone Encounter (Signed)
I could not reach the patient by phone so a letter was mailed out today.  Safaa Stingley,cma

## 2020-07-15 NOTE — Progress Notes (Signed)
After 3 attempts to reach the patient a letter was sent to his home informing him to call and schedule his lab appointment.  Cherry Turlington,cma

## 2020-07-22 ENCOUNTER — Ambulatory Visit: Payer: 59 | Admitting: Family Medicine

## 2020-08-17 NOTE — Progress Notes (Signed)
NEUROLOGY CONSULTATION NOTE  Stuart Bruce MRN: [id number] DOB: 09-01-1960  Referring provider: [name], MD Primary care provider: [name], MD  Reason for consult:  Ataxia, memory problems  Assessment/Plan:   1.  Memory deficits - likely multifactorial due to post-COVID symptoms, depression and thiamine deficiency.  .  However, I don't think he had neurocognitive disorder.  Symptoms have improved, particularly after starting B complex.  He scored 30/30 on SLUMS.  I do not suspect underlying neurodegenerative disease. 2.  Unsteady gait - no abnormalities on exam today.  Again, likely related to post-COVID symptoms, possibly episodes of hypotension, and thiamine deficiency. 3.  Cerebrovascular disease 4.  Hypertension 5.  Chronic alcohol use 6.  OSA  1.  Recommend starting ASA 81mg  daily 2.  Optimize stroke risk factors:  Follow up with PCP regarding elevated blood pressure, management of cholesterol 3.  Try to reduce alcohol intake 4.  Management of OSA 5.  Adequate treatment of depression 6.  Thiamine supplementation 7.  Follow up as needed..     Subjective:  Stuart Bruce is a 60 year old right-handed male with HTN, depression, alcohol abuse and OSA who presents for ataxia and memory problems.  History supplemented by referring provider's note.  He is accompanied his wife who also supplements history.  He had COVID-19 in December.  Following recovery, he started experiencing weakness in the legs.  He had a couple of mild falls, landing on his buttocks but he had a particularly bad fall down half a flight of stairs in 2022-07-04, striking his head.  He said he was upset at the time and was rushing down the stairs.  MRI of brain without contrast on 06/30/2020 personally reviewed showed moderate generalized cerebral atrophy and mild chronic small vessel ischemic changes with remote lacunar infarct in the left external capsule but no mass lesion or acute intracranial  abnormality.  Around this time, he also had exhibited some short term memory deficits.  He would frequently repeat questions.  He kept watching the same movies and programs, but he says it is because he just enjoys them.  Family is concerned because his mother had dementia (diagnosed in her mid-late 45s).  He reports increased depression.  Last summer he lost his job.  He has history of chronic alcohol use and started binging.  In 07/04/2022, his mother passed away.  Also his blood pressure had been difficult to control.  Blood pressure runs high but would drop up to 80s/60s on medication.  Labs in 2022/07/04 revealed elevated AST/ALT of 89/64, TSH 1.10, B12 425, and B1 7. He was advised to start thiamine supplement.  Since starting the supplement, he has improved.  Both balance and memory is better.  He is still drinking but is trying to curb it and is back in [place].   He has OSA.  He is not on CPAP but he says sleep has improved since losing 20 lbs.     PAST MEDICAL HISTORY: Past Medical History:  Diagnosis Date  . Depression   . Hypertension   . OSA (obstructive sleep apnea)    no CPAP use    PAST SURGICAL HISTORY: Past Surgical History:  Procedure Laterality Date  . HAND SURGERY Right 2005   Cyst on ring finger    MEDICATIONS: Current Outpatient Medications on File Prior to Visit  Medication Sig Dispense Refill  . Ascorbic Acid (VITAMIN C) 1000 MG tablet Take 1,000 mg by mouth daily.    Marland Kitchen  B Complex Vitamins (B COMPLEX 100 PO) Take 1 capsule by mouth daily.    . Coenzyme Q10 (CO Q-10) 200 MG CAPS Take 1 capsule by mouth daily.    Marland Kitchen doxazosin (CARDURA) 8 MG tablet TAKE 1 TABLET(8 MG) BY MOUTH DAILY 90 tablet 0  . escitalopram (LEXAPRO) 10 MG tablet Take 1 tablet (10 mg total) by mouth daily. 90 tablet 1  . fosinopril (MONOPRIL) 20 MG tablet Take 1 tablet (20 mg total) by mouth daily. 90 tablet 1  . Maca Root (MACA PO) Take 525 mg by mouth daily.    . Milk Thistle 300 MG CAPS Take 1 capsule by  mouth daily.    Marland Kitchen UNABLE TO FIND Med Name:Nugenix 3 capsules daily     Current Facility-Administered Medications on File Prior to Visit  Medication Dose Route Frequency Provider Last Rate Last Admin  . 0.9 %  sodium chloride infusion  500 mL Intravenous Continuous Pyrtle, Lajuan Lines, MD        ALLERGIES: No Known Allergies  FAMILY HISTORY: Family History  Problem Relation Age of Onset  . Cancer Mother        Breast  . Diabetes Father   . Cancer Father        Renal  . Drug abuse Paternal Aunt   . Colon cancer Neg Hx     Objective:  Blood pressure (!) 163/106, pulse 79, height 5\' 8"  (1.727 m), weight 203 lb 12.8 oz (92.4 kg), SpO2 96 %. General: No acute distress.  Patient appears well-groomed.   Head:  Normocephalic/atraumatic Eyes:  fundi examined but not visualized Neck: supple, no paraspinal tenderness, full range of motion Back: No paraspinal tenderness Heart: regular rate and rhythm Lungs: Clear to auscultation bilaterally. Vascular: No carotid bruits. Neurological Exam: Mental status: alert and oriented to person, place, and time, recent and remote memory intact, fund of knowledge intact, attention and concentration intact, speech fluent and not dysarthric, language intact. [place] Mental Exam 08/18/2020  Weekday Correct 1  Current year 1  What state are we in? 1  Amount spent 1  Amount left 2  # of Animals 3  5 objects recall 5  Number series 2  Hour markers 2  Time correct 2  Placed X in triangle correctly 1  Largest Figure 1  Name of male 2  Date back to work 2  Type of work 2  State she lived in 2  Total score 30   Cranial nerves: CN I: not tested CN II: pupils equal, round and reactive to light, visual fields intact CN III, IV, VI:  full range of motion, no nystagmus, no ptosis CN V: facial sensation intact. CN VII: upper and lower face symmetric CN VIII: hearing intact CN IX, X: gag intact, uvula midline CN XI: sternocleidomastoid and  trapezius muscles intact CN XII: tongue midline Bulk & Tone: normal, no fasciculations. Motor:  muscle strength 5/5 throughout Sensation:  Pinprick, temperature and vibratory sensation intact. Deep Tendon Reflexes:  2+ throughout,  toes downgoing.   Finger to nose testing:  Without dysmetria.   Heel to shin:  Without dysmetria.   Gait:  Normal station and stride.  Romberg negative.    Thank you for allowing me to take part in the care of this patient.  Metta Clines, DO  CC:  [name], MD

## 2020-08-18 ENCOUNTER — Other Ambulatory Visit: Payer: Self-pay

## 2020-08-18 ENCOUNTER — Ambulatory Visit: Payer: 59 | Admitting: Neurology

## 2020-08-18 ENCOUNTER — Encounter: Payer: Self-pay | Admitting: Neurology

## 2020-08-18 VITALS — BP 163/106 | HR 79 | Ht 68.0 in | Wt 203.8 lb

## 2020-08-18 DIAGNOSIS — Z7289 Other problems related to lifestyle: Secondary | ICD-10-CM | POA: Diagnosis not present

## 2020-08-18 DIAGNOSIS — R270 Ataxia, unspecified: Secondary | ICD-10-CM

## 2020-08-18 DIAGNOSIS — F321 Major depressive disorder, single episode, moderate: Secondary | ICD-10-CM

## 2020-08-18 DIAGNOSIS — I679 Cerebrovascular disease, unspecified: Secondary | ICD-10-CM

## 2020-08-18 DIAGNOSIS — F1090 Alcohol use, unspecified, uncomplicated: Secondary | ICD-10-CM

## 2020-08-18 DIAGNOSIS — R413 Other amnesia: Secondary | ICD-10-CM

## 2020-08-18 NOTE — Patient Instructions (Signed)
I don't think you have an underlying neurodegenerative disease.  I don't suspect that you have cognitive impairment.  Likely related to combination of low B1, depression, and possibly post-COVID symptoms.  I would start an aspirin 81mg  daily.  Try to continue treating stroke risk factors like high blood pressure, weight loss, treatment of sleep apnea and continuing to try and decrease alcohol intake.

## 2020-09-03 ENCOUNTER — Ambulatory Visit: Payer: 59 | Admitting: Family Medicine

## 2020-09-17 ENCOUNTER — Other Ambulatory Visit: Payer: Self-pay | Admitting: Family Medicine

## 2020-09-20 ENCOUNTER — Other Ambulatory Visit: Payer: Self-pay

## 2020-09-20 ENCOUNTER — Ambulatory Visit (INDEPENDENT_AMBULATORY_CARE_PROVIDER_SITE_OTHER): Payer: 59 | Admitting: Family Medicine

## 2020-09-20 VITALS — BP 160/100 | HR 76 | Temp 97.3°F | Resp 16 | Ht 68.0 in | Wt 202.0 lb

## 2020-09-20 DIAGNOSIS — I10 Essential (primary) hypertension: Secondary | ICD-10-CM

## 2020-09-20 DIAGNOSIS — E519 Thiamine deficiency, unspecified: Secondary | ICD-10-CM | POA: Diagnosis not present

## 2020-09-20 DIAGNOSIS — R270 Ataxia, unspecified: Secondary | ICD-10-CM | POA: Diagnosis not present

## 2020-09-20 DIAGNOSIS — Z7289 Other problems related to lifestyle: Secondary | ICD-10-CM | POA: Diagnosis not present

## 2020-09-20 DIAGNOSIS — F1010 Alcohol abuse, uncomplicated: Secondary | ICD-10-CM

## 2020-09-20 DIAGNOSIS — F1090 Alcohol use, unspecified, uncomplicated: Secondary | ICD-10-CM

## 2020-09-20 LAB — FOLATE: Folate: 9.8 ng/mL (ref >5.9)

## 2020-09-20 MED ORDER — DOXAZOSIN MESYLATE 8 MG PO TABS
ORAL_TABLET | ORAL | 3 refills | Status: DC
  Filled 2020-09-20: qty 90, 90d supply, fill #0

## 2020-09-20 NOTE — Progress Notes (Signed)
[name], MD Phone: [phone]  Stuart Bruce is a 60 y.o. male who presents today for f/u.  HYPERTENSION  Disease Monitoring  Home BP Monitoring 130/80 typically though ran out of cardura 2-3 days ago Chest pain- no    Dyspnea- no Medications  Compliance-  Taking cardura, fosinopril.  Edema- no  Ataxia: Patient saw neurology.  They felt like this is mostly related to his post Covid state, thiamine deficiency, and possible lightheadedness.  They did not see any evidence for a neurocognitive disorder.  They advised follow-up on an as-needed basis.  The patient notes no balance issues.  Alcohol abuse: Patient has abstained from alcohol.  At this point hard time since he said quite a few stressors in his life.  He is going to a class on a weekly basis at a counseling center for this.  He is keeping himself busy with art and music as well which is helpful.  He knows he needs to continue to abstain from alcohol.  Social History   Tobacco Use  Smoking Status Never Smoker  Smokeless Tobacco Never Used    Current Outpatient Medications on File Prior to Visit  Medication Sig Dispense Refill  . Ascorbic Acid (VITAMIN C) 1000 MG tablet Take 1,000 mg by mouth daily.    . B Complex Vitamins (B COMPLEX 100 PO) Take 1 capsule by mouth daily.    . Coenzyme Q10 (CO Q-10) 200 MG CAPS Take 1 capsule by mouth daily.    Marland Kitchen escitalopram (LEXAPRO) 10 MG tablet TAKE 1 TABLET(10 MG) BY MOUTH DAILY 90 tablet 1  . fosinopril (MONOPRIL) 20 MG tablet TAKE 1 TABLET(20 MG) BY MOUTH DAILY 90 tablet 1  . Maca Root (MACA PO) Take 525 mg by mouth daily.    . Milk Thistle 300 MG CAPS Take 1 capsule by mouth daily.    Marland Kitchen UNABLE TO FIND Med Name:Nugenix 3 capsules daily (Patient not taking: No sig reported)     Current Facility-Administered Medications on File Prior to Visit  Medication Dose Route Frequency Provider Last Rate Last Admin  . 0.9 %  sodium chloride infusion  500 mL Intravenous Continuous Pyrtle,  Lajuan Lines, MD         ROS see history of present illness  Objective  Physical Exam Vitals:   09/20/20 1354  BP: (!) 160/100  Pulse: 76  Resp: 16  Temp: (!) 97.3 F (36.3 C)  SpO2: 99%    BP Readings from Last 3 Encounters:  09/20/20 (!) 160/100  08/18/20 (!) 163/106  06/16/20 100/60   Wt Readings from Last 3 Encounters:  09/20/20 202 lb (91.6 kg)  08/18/20 203 lb 12.8 oz (92.4 kg)  06/16/20 203 lb 3.2 oz (92.2 kg)    Physical Exam Constitutional:      General: He is not in acute distress.    Appearance: He is not diaphoretic.  Cardiovascular:     Rate and Rhythm: Normal rate and regular rhythm.     Heart sounds: Normal heart sounds.  Pulmonary:     Effort: Pulmonary effort is normal.     Breath sounds: Normal breath sounds.  Musculoskeletal:     Right lower leg: No edema.     Left lower leg: No edema.  Skin:    General: Skin is warm and dry.  Neurological:     Mental Status: He is alert.      Assessment/Plan: Please see individual problem list.  Problem List Items Addressed This Visit  Benign essential HTN - Primary    Elevated today though the patient has run out of medication.  He will resume his Cardura 8 mg once daily.  He will continue his lisinopril 20 mg once daily.  He will monitor his blood pressure at home.  If it starts to trend up from his typical levels of 130/80 he will let us know.      Relevant Medications   doxazosin (CARDURA) 8 MG tablet   Chronic alcohol use    Patient has been abstinent.  I congratulated him on this.  I encouraged him to continue with his classes.      Ataxia    Resolved.  I suspect this was a combination of him being post Covid and thiamine deficiency.  He will monitor.      Thiamine deficiency    Continue B complex vitamin.  Check labs.       Other Visit Diagnoses    Alcohol abuse          This visit occurred during the SARS-CoV-2 public health emergency.  Safety protocols were in place, including  screening questions prior to the visit, additional usage of staff PPE, and extensive cleaning of exam room while observing appropriate contact time as indicated for disinfecting solutions.    [name], MD [place]

## 2020-09-20 NOTE — Assessment & Plan Note (Signed)
Elevated today though the patient has run out of medication.  He will resume his Cardura 8 mg once daily.  He will continue his lisinopril 20 mg once daily.  He will monitor his blood pressure at home.  If it starts to trend up from his typical levels of 130/80 he will let us know.

## 2020-09-20 NOTE — Addendum Note (Signed)
Addended by: Caryl Bis, Elexia Friedt G on: 09/20/2020 02:10 PM   Modules accepted: Orders

## 2020-09-20 NOTE — Assessment & Plan Note (Signed)
Continue B complex vitamin.  Check labs.

## 2020-09-20 NOTE — Assessment & Plan Note (Signed)
Patient has been abstinent.  I congratulated him on this.  I encouraged him to continue with his classes.

## 2020-09-20 NOTE — Assessment & Plan Note (Signed)
Resolved.  I suspect this was a combination of him being post Covid and thiamine deficiency.  He will monitor.

## 2020-09-20 NOTE — Patient Instructions (Signed)
Nice to see you. We will get labs today and let you know the results. If your blood pressure trends up above the 130/80 level please let us know.

## 2020-09-21 ENCOUNTER — Other Ambulatory Visit: Payer: Self-pay

## 2020-09-24 LAB — VITAMIN B1: Vitamin B1 (Thiamine): 28 nmol/L (ref 8–30)

## 2020-10-18 DIAGNOSIS — Z20822 Contact with and (suspected) exposure to covid-19: Secondary | ICD-10-CM | POA: Diagnosis not present

## 2020-10-18 DIAGNOSIS — Z03818 Encounter for observation for suspected exposure to other biological agents ruled out: Secondary | ICD-10-CM | POA: Diagnosis not present

## 2020-12-24 ENCOUNTER — Other Ambulatory Visit: Payer: Self-pay

## 2020-12-24 ENCOUNTER — Emergency Department: Payer: 59

## 2020-12-24 DIAGNOSIS — I10 Essential (primary) hypertension: Secondary | ICD-10-CM | POA: Diagnosis not present

## 2020-12-24 DIAGNOSIS — W010XXA Fall on same level from slipping, tripping and stumbling without subsequent striking against object, initial encounter: Secondary | ICD-10-CM | POA: Insufficient documentation

## 2020-12-24 DIAGNOSIS — R0902 Hypoxemia: Secondary | ICD-10-CM | POA: Diagnosis not present

## 2020-12-24 DIAGNOSIS — Y92481 Parking lot as the place of occurrence of the external cause: Secondary | ICD-10-CM | POA: Diagnosis not present

## 2020-12-24 DIAGNOSIS — S199XXA Unspecified injury of neck, initial encounter: Secondary | ICD-10-CM | POA: Diagnosis not present

## 2020-12-24 DIAGNOSIS — F10129 Alcohol abuse with intoxication, unspecified: Secondary | ICD-10-CM | POA: Diagnosis not present

## 2020-12-24 DIAGNOSIS — W19XXXA Unspecified fall, initial encounter: Secondary | ICD-10-CM | POA: Diagnosis not present

## 2020-12-24 DIAGNOSIS — M47812 Spondylosis without myelopathy or radiculopathy, cervical region: Secondary | ICD-10-CM | POA: Diagnosis not present

## 2020-12-24 DIAGNOSIS — S0181XA Laceration without foreign body of other part of head, initial encounter: Secondary | ICD-10-CM | POA: Insufficient documentation

## 2020-12-24 DIAGNOSIS — S8002XA Contusion of left knee, initial encounter: Secondary | ICD-10-CM | POA: Diagnosis not present

## 2020-12-24 DIAGNOSIS — S60011A Contusion of right thumb without damage to nail, initial encounter: Secondary | ICD-10-CM | POA: Diagnosis not present

## 2020-12-24 DIAGNOSIS — S0990XA Unspecified injury of head, initial encounter: Secondary | ICD-10-CM | POA: Diagnosis not present

## 2020-12-24 DIAGNOSIS — Z79899 Other long term (current) drug therapy: Secondary | ICD-10-CM | POA: Insufficient documentation

## 2020-12-24 NOTE — ED Triage Notes (Signed)
Pt with etoh ingestion. Pt states he fell over a curb tonight landing on forehead. Pt with laceration noted to mid forehead with controlled bleeding approx 2.6cm in length. Pt denies loc, denies headache and denies neck pain. Pt with chronic ETOH abuse.

## 2020-12-25 ENCOUNTER — Emergency Department
Admission: EM | Admit: 2020-12-25 | Discharge: 2020-12-25 | Disposition: A | Discharge status: Home / Self Care | Payer: 59 | Attending: Emergency Medicine | Admitting: Emergency Medicine

## 2020-12-25 DIAGNOSIS — S60011A Contusion of right thumb without damage to nail, initial encounter: Secondary | ICD-10-CM

## 2020-12-25 DIAGNOSIS — S0990XA Unspecified injury of head, initial encounter: Secondary | ICD-10-CM

## 2020-12-25 DIAGNOSIS — Z79899 Other long term (current) drug therapy: Secondary | ICD-10-CM | POA: Diagnosis not present

## 2020-12-25 DIAGNOSIS — F10929 Alcohol use, unspecified with intoxication, unspecified: Secondary | ICD-10-CM

## 2020-12-25 DIAGNOSIS — S0181XA Laceration without foreign body of other part of head, initial encounter: Secondary | ICD-10-CM

## 2020-12-25 DIAGNOSIS — F10129 Alcohol abuse with intoxication, unspecified: Secondary | ICD-10-CM | POA: Diagnosis not present

## 2020-12-25 DIAGNOSIS — W19XXXA Unspecified fall, initial encounter: Secondary | ICD-10-CM

## 2020-12-25 DIAGNOSIS — S8002XA Contusion of left knee, initial encounter: Secondary | ICD-10-CM | POA: Diagnosis not present

## 2020-12-25 DIAGNOSIS — I10 Essential (primary) hypertension: Secondary | ICD-10-CM | POA: Diagnosis not present

## 2020-12-25 NOTE — ED Provider Notes (Addendum)
[facility] Emergency Department Provider Note  ____________________________________________   Event Date/Time   First MD Initiated Contact with Patient 12/25/20 0012     (approximate)  I have reviewed the triage vital signs and the nursing notes.   HISTORY  Chief Complaint Fall    HPI Stuart Bruce is a 60 y.o. male who admits to having a problem with alcohol abuse and presents for evaluation after a fall in a parking lot with a laceration to his forehead.  He admits to drinking and says that he stumbled and tripped and struck his head on the pavement.  Bleeding is well controlled.  He has little bit of pain in his right thumb and his left knee but he is able to bear weight and is able to use his hand normally.  He is not on blood thinners.  He says he is up-to-date on his tetanus vaccination.  He has some mild pain mostly in his thumb but otherwise he does not have any pain.  He is awake, alert, oriented, and regretful about what happened.     Past Medical History:  Diagnosis Date   Depression    Hypertension    OSA (obstructive sleep apnea)    no CPAP use    Patient Active Problem List   Diagnosis Date Noted   Thiamine deficiency 09/20/2020   Ataxia 06/18/2020   Leg weakness, bilateral 06/18/2020   Obesity (BMI 30.0-34.9) 01/09/2020   Macrocytosis 10/08/2019   Depression, major, single episode, moderate (HCC) 06/13/2019   Chronic alcohol use 06/13/2019   Right shoulder pain 06/13/2019   Respiratory illness 08/28/2018   BRBPR (bright red blood per rectum) 07/26/2016   Elevated LFTs 02/18/2016   Benign essential HTN 07/26/2015   Lower extremity pain, left 05/19/2015   Obstructive sleep apnea 04/10/2008   ABNORMAL HEART RHYTHMS 04/10/2008    Past Surgical History:  Procedure Laterality Date   HAND SURGERY Right 2005   Cyst on ring finger    Prior to Admission medications   Medication Sig Start Date End Date Taking? Authorizing Provider   Ascorbic Acid (VITAMIN C) 1000 MG tablet Take 1,000 mg by mouth daily.    [provider]  B Complex Vitamins (B COMPLEX 100 PO) Take 1 capsule by mouth daily.    [provider]  Coenzyme Q10 (CO Q-10) 200 MG CAPS Take 1 capsule by mouth daily.    [provider]  doxazosin (CARDURA) 8 MG tablet TAKE 1 TABLET(8 MG) BY MOUTH DAILY 09/20/20   Leone Haven, MD  escitalopram (LEXAPRO) 10 MG tablet TAKE 1 TABLET(10 MG) BY MOUTH DAILY 09/20/20   Leone Haven, MD  fosinopril (MONOPRIL) 20 MG tablet TAKE 1 TABLET(20 MG) BY MOUTH DAILY 09/20/20   Leone Haven, MD  Maca Root (MACA PO) Take 525 mg by mouth daily.    [provider]  Milk Thistle 300 MG CAPS Take 1 capsule by mouth daily.    [provider]    Allergies Patient has no known allergies.  Family History  Problem Relation Age of Onset   Cancer Mother        Breast   Diabetes Father    Cancer Father        Renal   Drug abuse Paternal Aunt    Colon cancer Neg Hx     Social History Social History   Tobacco Use   Smoking status: Never   Smokeless tobacco: Never  Substance Use  Topics   Alcohol use: Yes    Alcohol/week: 29.0 standard drinks    Types: 14 Glasses of wine, 14 Cans of beer, 1 Shots of liquor per week    Comment: 2-4 drinks daily beer/wine, & occ. 1 shot liquor   Drug use: No    Review of Systems Constitutional: No fever/chills.  Admits to alcohol intoxication. Eyes: No visual changes. Cardiovascular: Denies chest pain. Respiratory: Denies shortness of breath. Gastrointestinal: No abdominal pain.  No nausea, no vomiting. Genitourinary: Negative for dysuria. Musculoskeletal: Forehead laceration, right thumb pain, left knee pain. Integumentary: Forehead laceration Neurological: Negative for headaches, focal weakness or numbness.   ____________________________________________   PHYSICAL EXAM:  VITAL SIGNS: ED Triage Vitals  Enc Vitals Group      BP 12/24/20 2023 (!) 136/95     Pulse Rate 12/24/20 2023 92     Resp 12/24/20 2023 16     Temp 12/24/20 2023 98.2 F (36.8 C)     Temp Source 12/24/20 2023 Oral     SpO2 12/24/20 2023 100 %     Weight 12/24/20 2024 86.2 kg (190 lb)     Height 12/24/20 2024 1.702 m ('5\' 7"'$ )     Head Circumference --      Peak Flow --      Pain Score 12/24/20 2024 0     Pain Loc --      Pain Edu? --      Excl. in [place]? --     Constitutional: Alert and oriented.  Eyes: Conjunctivae are normal.  Head: 2.5 cm vertical laceration in the middle of the forehead with surrounding contusion, no active bleeding Nose: No congestion/rhinnorhea. Mouth/Throat: Patient is wearing a mask. Neck: No stridor.  No meningeal signs.   Cardiovascular: Normal rate, regular rhythm. Good peripheral circulation. Respiratory: Normal respiratory effort.  No retractions. Gastrointestinal: Soft and nontender. No distention.  Musculoskeletal: Weight-bearing, some pain in left knee but no evidence of fracture/dislocation.  Right thumb pain but no snuff-box tenderness, normal grip, normal range of motion. Neurologic:  Normal speech and language. No gross focal neurologic deficits are appreciated.  Skin:  Skin is warm, dry, laceration on forehead as previously described. Psychiatric: Mood and affect are normal. Speech and behavior are normal.  ____________________________________________    .Marland KitchenLaceration Repair  Date/Time: 12/25/2020 12:43 AM Performed by: Hinda Kehr, MD Authorized by: Hinda Kehr, MD   Consent:    Consent obtained:  Verbal   Consent given by:  Patient   Risks discussed:  Infection, poor cosmetic result and pain Universal protocol:    Patient identity confirmed:  Verbally with patient and arm band Anesthesia:    Anesthesia method:  None Laceration details:    Location:  Face   Face location:  Forehead   Length (cm):  2.5 Exploration:    Contaminated: no   Treatment:    Amount of cleaning:   Standard   Irrigation solution:  Sterile saline   Visualized foreign bodies/material removed: no   Skin repair:    Repair method:  Tissue adhesive and Steri-Strips   Number of Steri-Strips:  3 Approximation:    Approximation:  Close Repair type:    Repair type:  Simple Post-procedure details:    Dressing:  Open (no dressing)   Procedure completion:  Tolerated well, no immediate complications   ____________________________________________  RADIOLOGY Ursula Alert, personally viewed and evaluated these images (plain radiographs) as part of my medical decision making, as well as reviewing the written report by  the radiologist.  ED MD interpretation: CT head and CT cervical spine are unremarkable with no sign of acute injury  Official radiology report(s): CT Head Wo Contrast  Result Date: 12/24/2020 CLINICAL DATA:  Neck trauma, intoxicated or obtunded (Age >= 16y). Pt with etoh ingestion. Pt states he fell over a curb tonight landing on forehead. Pt with laceration noted to mid forehead with controlled bleeding approx 2.6cm in length. EXAM: CT HEAD WITHOUT CONTRAST CT CERVICAL SPINE WITHOUT CONTRAST TECHNIQUE: Multidetector CT imaging of the head and cervical spine was performed following the standard protocol without intravenous contrast. Multiplanar CT image reconstructions of the cervical spine were also generated. COMPARISON:  None. FINDINGS: CT HEAD FINDINGS Brain: Cerebral ventricle sizes are concordant with the degree of cerebral volume loss. Patchy and confluent areas of decreased attenuation are noted throughout the deep and periventricular white matter of the cerebral hemispheres bilaterally, compatible with chronic microvascular ischemic disease. No evidence of large-territorial acute infarction. No parenchymal hemorrhage. No mass lesion. No extra-axial collection. No mass effect or midline shift. No hydrocephalus. Basilar cisterns are patent. Vascular: No hyperdense vessel. Skull:  No acute fracture or focal lesion. Sinuses/Orbits: Paranasal sinuses and mastoid air cells are clear. The orbits are unremarkable. Other: Mild left frontal scalp edema. CT CERVICAL SPINE FINDINGS Alignment: Normal. Skull base and vertebrae: Multilevel degenerative changes spine most prominent at the C5 through C7 levels. Moderate multilevel right osseous no foraminal stenosis. No severe osseous neural foraminal or central canal stenosis. No acute fracture. No aggressive appearing focal osseous lesion or focal pathologic process. Soft tissues and spinal canal: No prevertebral fluid or swelling. No visible canal hematoma. Upper chest: Unremarkable. Other: None. IMPRESSION: 1. No acute intracranial abnormality. 2. No acute displaced fracture or traumatic listhesis of the cervical spine. Electronically Signed   By: Iven Finn M.D.   On: 12/24/2020 21:45   CT Cervical Spine Wo Contrast  Result Date: 12/24/2020 CLINICAL DATA:  Neck trauma, intoxicated or obtunded (Age >= 16y). Pt with etoh ingestion. Pt states he fell over a curb tonight landing on forehead. Pt with laceration noted to mid forehead with controlled bleeding approx 2.6cm in length. EXAM: CT HEAD WITHOUT CONTRAST CT CERVICAL SPINE WITHOUT CONTRAST TECHNIQUE: Multidetector CT imaging of the head and cervical spine was performed following the standard protocol without intravenous contrast. Multiplanar CT image reconstructions of the cervical spine were also generated. COMPARISON:  None. FINDINGS: CT HEAD FINDINGS Brain: Cerebral ventricle sizes are concordant with the degree of cerebral volume loss. Patchy and confluent areas of decreased attenuation are noted throughout the deep and periventricular white matter of the cerebral hemispheres bilaterally, compatible with chronic microvascular ischemic disease. No evidence of large-territorial acute infarction. No parenchymal hemorrhage. No mass lesion. No extra-axial collection. No mass effect or midline  shift. No hydrocephalus. Basilar cisterns are patent. Vascular: No hyperdense vessel. Skull: No acute fracture or focal lesion. Sinuses/Orbits: Paranasal sinuses and mastoid air cells are clear. The orbits are unremarkable. Other: Mild left frontal scalp edema. CT CERVICAL SPINE FINDINGS Alignment: Normal. Skull base and vertebrae: Multilevel degenerative changes spine most prominent at the C5 through C7 levels. Moderate multilevel right osseous no foraminal stenosis. No severe osseous neural foraminal or central canal stenosis. No acute fracture. No aggressive appearing focal osseous lesion or focal pathologic process. Soft tissues and spinal canal: No prevertebral fluid or swelling. No visible canal hematoma. Upper chest: Unremarkable. Other: None. IMPRESSION: 1. No acute intracranial abnormality. 2. No acute displaced fracture or traumatic listhesis of  the cervical spine. Electronically Signed   By: Iven Finn M.D.   On: 12/24/2020 21:45    ____________________________________________   INITIAL IMPRESSION / MDM / ASSESSMENT AND PLAN / ED COURSE  As part of my medical decision making, I reviewed the following data within the [place] notes reviewed and incorporated, Old chart reviewed, and Notes from prior ED visits   Differential diagnosis includes, but is not limited to, alcohol intoxication, intracranial bleeding, fracture/dislocation.  CTs reassuring, no emergent findings.  Patient clinically sober, vitals stable.   Dermabond and Steri-Strips on forehead laceration.  Patient will call for a ride home.  No indication that he needs additional x-rays or has any additional fractures or dislocations.  I gave my usual and customary follow-up recommendations, management recommendations, and return precautions, and he agrees with the plan.  ____________________________________________  FINAL CLINICAL IMPRESSION(S) / ED DIAGNOSES  Final diagnoses:  Fall, initial  encounter  Minor head injury, initial encounter  Forehead laceration, initial encounter  Alcoholic intoxication with complication ([place])  Contusion of left knee, initial encounter  Contusion of right thumb without damage to nail, initial encounter     MEDICATIONS GIVEN DURING THIS VISIT:  Medications - No data to display   ED Discharge Orders     None        Note:  This document was prepared using Dragon voice recognition software and may include unintentional dictation errors.   Hinda Kehr, MD 12/25/20 [place], [place], MD 12/25/20 [phone]

## 2020-12-26 DIAGNOSIS — S8002XA Contusion of left knee, initial encounter: Secondary | ICD-10-CM | POA: Diagnosis not present

## 2021-01-11 DIAGNOSIS — I10 Essential (primary) hypertension: Secondary | ICD-10-CM | POA: Diagnosis not present

## 2021-01-11 DIAGNOSIS — F1020 Alcohol dependence, uncomplicated: Secondary | ICD-10-CM | POA: Diagnosis not present

## 2021-01-11 DIAGNOSIS — R600 Localized edema: Secondary | ICD-10-CM | POA: Diagnosis not present

## 2021-01-11 DIAGNOSIS — F4321 Adjustment disorder with depressed mood: Secondary | ICD-10-CM | POA: Diagnosis not present

## 2021-01-11 DIAGNOSIS — R7401 Elevation of levels of liver transaminase levels: Secondary | ICD-10-CM | POA: Diagnosis not present

## 2021-01-11 DIAGNOSIS — E876 Hypokalemia: Secondary | ICD-10-CM | POA: Diagnosis not present

## 2021-01-12 DIAGNOSIS — F1020 Alcohol dependence, uncomplicated: Secondary | ICD-10-CM | POA: Diagnosis not present

## 2021-01-21 ENCOUNTER — Ambulatory Visit: Payer: 59 | Admitting: Family Medicine

## 2021-01-23 DIAGNOSIS — F1020 Alcohol dependence, uncomplicated: Secondary | ICD-10-CM | POA: Diagnosis not present

## 2021-02-03 DIAGNOSIS — F1020 Alcohol dependence, uncomplicated: Secondary | ICD-10-CM | POA: Diagnosis not present

## 2021-02-07 DIAGNOSIS — F1020 Alcohol dependence, uncomplicated: Secondary | ICD-10-CM | POA: Diagnosis not present

## 2021-02-08 DIAGNOSIS — F1020 Alcohol dependence, uncomplicated: Secondary | ICD-10-CM | POA: Diagnosis not present

## 2021-02-17 ENCOUNTER — Emergency Department: Payer: Managed Care, Other (non HMO)

## 2021-02-17 ENCOUNTER — Other Ambulatory Visit: Payer: Self-pay

## 2021-02-17 ENCOUNTER — Emergency Department
Admission: EM | Admit: 2021-02-17 | Discharge: 2021-02-18 | Disposition: A | Discharge status: Home / Self Care | Payer: Managed Care, Other (non HMO) | Attending: Emergency Medicine | Admitting: Emergency Medicine

## 2021-02-17 DIAGNOSIS — F329 Major depressive disorder, single episode, unspecified: Secondary | ICD-10-CM | POA: Diagnosis not present

## 2021-02-17 DIAGNOSIS — R4182 Altered mental status, unspecified: Secondary | ICD-10-CM

## 2021-02-17 DIAGNOSIS — Z79899 Other long term (current) drug therapy: Secondary | ICD-10-CM | POA: Insufficient documentation

## 2021-02-17 DIAGNOSIS — Z7982 Long term (current) use of aspirin: Secondary | ICD-10-CM | POA: Insufficient documentation

## 2021-02-17 DIAGNOSIS — F10120 Alcohol abuse with intoxication, uncomplicated: Secondary | ICD-10-CM | POA: Diagnosis not present

## 2021-02-17 DIAGNOSIS — R29810 Facial weakness: Secondary | ICD-10-CM | POA: Insufficient documentation

## 2021-02-17 DIAGNOSIS — F1010 Alcohol abuse, uncomplicated: Secondary | ICD-10-CM | POA: Diagnosis not present

## 2021-02-17 DIAGNOSIS — Z20822 Contact with and (suspected) exposure to covid-19: Secondary | ICD-10-CM | POA: Insufficient documentation

## 2021-02-17 DIAGNOSIS — R2990 Unspecified symptoms and signs involving the nervous system: Secondary | ICD-10-CM | POA: Diagnosis not present

## 2021-02-17 DIAGNOSIS — I639 Cerebral infarction, unspecified: Secondary | ICD-10-CM | POA: Diagnosis not present

## 2021-02-17 DIAGNOSIS — F10920 Alcohol use, unspecified with intoxication, uncomplicated: Secondary | ICD-10-CM

## 2021-02-17 DIAGNOSIS — R404 Transient alteration of awareness: Secondary | ICD-10-CM | POA: Diagnosis not present

## 2021-02-17 DIAGNOSIS — F29 Unspecified psychosis not due to a substance or known physiological condition: Secondary | ICD-10-CM | POA: Diagnosis not present

## 2021-02-17 DIAGNOSIS — F10129 Alcohol abuse with intoxication, unspecified: Secondary | ICD-10-CM | POA: Diagnosis not present

## 2021-02-17 DIAGNOSIS — R0689 Other abnormalities of breathing: Secondary | ICD-10-CM | POA: Insufficient documentation

## 2021-02-17 DIAGNOSIS — R4781 Slurred speech: Secondary | ICD-10-CM | POA: Diagnosis not present

## 2021-02-17 DIAGNOSIS — R4020 Unspecified coma: Secondary | ICD-10-CM | POA: Diagnosis not present

## 2021-02-17 DIAGNOSIS — Y908 Blood alcohol level of 240 mg/100 ml or more: Secondary | ICD-10-CM | POA: Diagnosis not present

## 2021-02-17 DIAGNOSIS — R45851 Suicidal ideations: Secondary | ICD-10-CM

## 2021-02-17 DIAGNOSIS — I10 Essential (primary) hypertension: Secondary | ICD-10-CM | POA: Insufficient documentation

## 2021-02-17 DIAGNOSIS — R0902 Hypoxemia: Secondary | ICD-10-CM | POA: Diagnosis not present

## 2021-02-17 DIAGNOSIS — F32A Depression, unspecified: Secondary | ICD-10-CM | POA: Diagnosis present

## 2021-02-17 LAB — PROTIME-INR
INR: 1 (ref 0.8–1.2)
Prothrombin Time: 13.3 seconds (ref 11.4–15.2)

## 2021-02-17 LAB — SALICYLATE LEVEL: Salicylate Lvl: <7 mg/dL — ABNORMAL LOW (ref 7.0–30.0)

## 2021-02-17 LAB — CBC WITH DIFFERENTIAL/PLATELET
Abs Immature Granulocytes: 0.01 10*3/uL (ref 0.00–0.07)
Basophils Absolute: 0 10*3/uL (ref 0.0–0.1)
Basophils Relative: 1 %
Eosinophils Absolute: 0.3 10*3/uL (ref 0.0–0.5)
Eosinophils Relative: 5 %
HCT: 47.4 % (ref 39.0–52.0)
Hemoglobin: 16.8 g/dL (ref 13.0–17.0)
Immature Granulocytes: 0 %
Lymphocytes Relative: 33 %
Lymphs Abs: 1.5 10*3/uL (ref 0.7–4.0)
MCH: 36 pg — ABNORMAL HIGH (ref 26.0–34.0)
MCHC: 35.4 g/dL (ref 30.0–36.0)
MCV: 101.5 fL — ABNORMAL HIGH (ref 80.0–100.0)
Monocytes Absolute: 0.4 10*3/uL (ref 0.1–1.0)
Monocytes Relative: 8 %
Neutro Abs: 2.4 10*3/uL (ref 1.7–7.7)
Neutrophils Relative %: 53 %
Platelets: 166 10*3/uL (ref 150–400)
RBC: 4.67 MIL/uL (ref 4.22–5.81)
RDW: 13.1 % (ref 11.5–15.5)
WBC: 4.6 10*3/uL (ref 4.0–10.5)
nRBC: 0 % (ref 0.0–0.2)

## 2021-02-17 LAB — TROPONIN I (HIGH SENSITIVITY)
Troponin I (High Sensitivity): 7 ng/L (ref <18)
Troponin I (High Sensitivity): 7 ng/L (ref <18)

## 2021-02-17 LAB — BLOOD GAS, ARTERIAL
Acid-Base Excess: 1 mmol/L (ref 0.0–2.0)
Bicarbonate: 25.2 mmol/L (ref 20.0–28.0)
FIO2: 0.21
O2 Saturation: 98.4 %
Patient temperature: 37
pCO2 arterial: 38 mmHg (ref 32.0–48.0)
pH, Arterial: 7.43 (ref 7.350–7.450)
pO2, Arterial: 110 mmHg — ABNORMAL HIGH (ref 83.0–108.0)

## 2021-02-17 LAB — COMPREHENSIVE METABOLIC PANEL
ALT: 18 U/L (ref 0–44)
AST: 26 U/L (ref 15–41)
Albumin: 3.9 g/dL (ref 3.5–5.0)
Alkaline Phosphatase: 65 U/L (ref 38–126)
Anion gap: 10 (ref 5–15)
BUN: 8 mg/dL (ref 6–20)
CO2: 26 mmol/L (ref 22–32)
Calcium: 8.3 mg/dL — ABNORMAL LOW (ref 8.9–10.3)
Chloride: 103 mmol/L (ref 98–111)
Creatinine, Ser: 0.74 mg/dL (ref 0.61–1.24)
GFR, Estimated: >60 mL/min (ref >60)
Glucose, Bld: 140 mg/dL — ABNORMAL HIGH (ref 70–99)
Potassium: 3.6 mmol/L (ref 3.5–5.1)
Sodium: 139 mmol/L (ref 135–145)
Total Bilirubin: 1 mg/dL (ref 0.3–1.2)
Total Protein: 7.1 g/dL (ref 6.5–8.1)

## 2021-02-17 LAB — CBG MONITORING, ED: Glucose-Capillary: 128 mg/dL — ABNORMAL HIGH (ref 70–99)

## 2021-02-17 LAB — RESP PANEL BY RT-PCR (FLU A&B, COVID) ARPGX2
Influenza A by PCR: NEGATIVE
Influenza B by PCR: NEGATIVE
SARS Coronavirus 2 by RT PCR: NEGATIVE

## 2021-02-17 LAB — BRAIN NATRIURETIC PEPTIDE: B Natriuretic Peptide: 86.6 pg/mL (ref 0.0–100.0)

## 2021-02-17 LAB — ACETAMINOPHEN LEVEL: Acetaminophen (Tylenol), Serum: <10 ug/mL — ABNORMAL LOW (ref 10–30)

## 2021-02-17 LAB — ETHANOL: Alcohol, Ethyl (B): 469 mg/dL (ref <10)

## 2021-02-17 LAB — APTT: aPTT: 31 seconds (ref 24–36)

## 2021-02-17 LAB — LIPASE, BLOOD: Lipase: 41 U/L (ref 11–51)

## 2021-02-17 LAB — LACTIC ACID, PLASMA: Lactic Acid, Venous: 3.1 mmol/L (ref 0.5–1.9)

## 2021-02-17 MED ORDER — THIAMINE HCL 100 MG/ML IJ SOLN
100.0000 mg | Freq: Once | INTRAMUSCULAR | Status: AC
  Administered 2021-02-17: 100 mg via INTRAVENOUS
  Filled 2021-02-17: qty 2

## 2021-02-17 MED ORDER — IOHEXOL 350 MG/ML SOLN
75.0000 mL | Freq: Once | INTRAVENOUS | Status: AC | PRN
  Administered 2021-02-17: 75 mL via INTRAVENOUS

## 2021-02-17 MED ORDER — THIAMINE HCL 100 MG/ML IJ SOLN
INTRAVENOUS | Status: DC
  Filled 2021-02-17 (×6): qty 1000

## 2021-02-17 NOTE — Consult Note (Signed)
Virtual Visit via Video Note  I connected with Stuart Bruce on '@TODAY'$ @ at  by a video enabled telemedicine application and verified that I am speaking with the correct person using two identifiers.  Location: Patient: [name] Provider: Dr Corinna Capra   I discussed the limitations of evaluation and management by telemedicine and the availability of in person appointments. The patient expressed understanding and agreed to proceed.  History of Present Illness: 60 year old Caucasian male with history of heavy alcoholism who was recently in alcohol rehab and discharged 2 weeks ago for.  He was scheduled to go to the to another rehab facility for alcohol rehab.  Last seen normal by family at 2:30 PM.  At 3:15 PM they found him to be slumped over in unresponsive.  Patient was found to have some smell of alcohol on his breath breath.  Patient has been on naltrexone for alcohol withdrawal but is unclear if he took it today or not.  Patient was found to be his snoring on arrival but responsive to pain opening his eyes but then going back to sleep.  He was also noticed having some slurred speech and facial droop and hence a code stroke was called.  When I evaluated the patient initially was quite sleepy but could be aroused with some effort but had difficulty in sustaining attention.  He had trouble following commands and NIH stroke scale was 8 with mostly related to his mental status with inconsistent effort on motor system testing.  Patient went for emergent CT scan of the head which I personally reviewed was unremarkable for any acute abnormality.  Subsequently he had lab work drawn and alcohol level which is still pending at the time of this dictation.  CT angiogram of the brain was also obtained emergently which showed no large vessel stenosis or occlusion.  There is mild stenosis noted in the right posterior cerebral artery which was felt to be incidental and not related to his presentation.  Patient mental  status and neurological exam improved after he came back from CT angio and he was more responsive and following commands and NIH stroke scale improved to 2 with only mild dysarthria and right lower facial asymmetry. Last seen normal 1430 hrs. Baseline modified Rankin 0 NIH stroke scale initially 8 on arrival subsequently improved to 2 IV thrombolysis considered no as presentation felt not to be consistent with a stroke and rapid improvement Mechanical thrombectomy considered no as presentation not consistent with LVO     Observations/Objective: Obese middle-aged Caucasian male.  Not in distress.  Very sleepy. Patient can be aroused with some difficulty with difficulty in sustaining attention.  Oriented to person only.  Follows simple midline and one-step commands only.  Extraocular movements full range without nystagmus.  Blinks to threat bilaterally.  Slight lower facial droop on the right when smiles.  Tongue midline.  Motor system exam no clear-cut focal weakness but variable effort on strength testing with variable drips.  Sensation intact bilaterally.  Gait not tested. Initial NIH stroke scale 8 mostly due to mental status subsequently improved to 2 Assessment and Plan: 33 gentleman with sudden onset of altered mental status with sleepiness with some slurred speech and facial droop of unclear etiology.  Possibly alcohol intoxication or unwitnessed seizure with postictal state. Clinical presentation and rapid improvement did not suggest stroke and patient is not a candidate for IV thrombolysis.   Follow Up Instructions: Check alcohol level and labs admission labs.  If alcohol intoxication is  not found to be the cause of his presentation possibility could be alcohol withdrawal seizure with postictal state.  May arrange for outpatient neurology follow-up.  No further stroke related testing is indicated at the present time.  Kindly call for follow-up questions  Dr. Su Monks neuro hospitalist  on-call Home I have signed out this case to.  Discussed with Dr. Corinna Capra, ER physician.    I discussed the assessment and treatment plan with the patient. The patient was provided an opportunity to ask questions and all were answered. The patient agreed with the plan and demonstrated an understanding of the instructions.   The patient was advised to call back or seek an in-person evaluation if the symptoms worsen or if the condition fails to improve as anticipated.  I provided 80 minutes of non-face-to-face time during this encounter.   Antony Contras, MD

## 2021-02-17 NOTE — Progress Notes (Signed)
  Chaplain On-Call responded to Code Stroke notification at 1654 hours.  Chaplain arrived in the ED and learned that the patient has returned from CT scan procedure.  The medical team is continuing to assess the patient, who is not available for a visit at this time.  Chaplains are available as needed for additional support.  Chaplain Pollyann Samples M.Div., [facility]

## 2021-02-17 NOTE — ED Notes (Signed)
Patient transported to CT 

## 2021-02-17 NOTE — ED Notes (Signed)
This RN noticing slight facial droop on left side, pt not increasing in repsonsiveness, EDP notified.

## 2021-02-17 NOTE — BH Assessment (Signed)
Comprehensive Clinical Assessment (CCA) Note  02/17/2021 Stuart Bruce [id number]  Chief Complaint: Patient is a 60 year old male presenting to [facility] ED voluntarily with his wife. Per triage note Pt from home, pt discharged from rehab 2 weeks ago, supposed to leave today for another rehab for ETOH. Pt found with AMS per family at 1515, responsive to pain. Pt found with ETOH smell on his breath and has been taking naltrexone but report is unclear if he took any today. Pt snoring on arrival, responsive to pain, opening eyes, but then going back to sleep. During assessment patient appeared alert and oriented x1, patient is unaware of where he is or the situation. Patient reports "I thought I was going to jail." Patient is unable to answer many questions as he is disoriented and visibly impaired. Patient is able to report that he sleeps and eats okay. When asked if patient is experiencing any hallucinations patient does not answer. When asked if patient has any family, patient points to do the door and says "she's my family, my wife." Patient is able to deny SI/HI.  Collateral information is obtained from [place], patient's wife who reports "from what I know, he had been in alcohol rehab a couple of weeks ago and he came home and started drinking again and he was supposed to go back today, when he got here he was very intoxicated and he slumped over his computer and we couldn't get him to wake up and we called EMS." "He has been very depressed since he got home, he lost his mom this past year, COVID has kept him at home and he's been talking about wanting his gun to blow his brains out."   Disposition pending Chief Complaint  Patient presents with   Altered Mental Status   Visit Diagnosis: Depression, Alcohol Abuse    CCA Screening, Triage and Referral (STR)  Patient Reported Information How did you hear about [place]? Family/Friend  Referral name: No data recorded Referral phone number:  No data recorded  Whom do you see for routine medical problems? No data recorded Practice/Facility Name: No data recorded Practice/Facility Phone Number: No data recorded Name of Contact: No data recorded Contact Number: No data recorded Contact Fax Number: No data recorded Prescriber Name: No data recorded Prescriber Address (if known): No data recorded  What Is the Reason for Your Visit/Call Today? Patient presents voluntarily with his wife for altered mental status  How Long Has This Been Causing You Problems? 1-6 months  What Do You Feel Would Help You the Most Today? No data recorded  Have You Recently Been in Any Inpatient Treatment (Hospital/Detox/Crisis Center/28-Day Program)? No data recorded Name/Location of Program/Hospital:No data recorded How Long Were You There? No data recorded When Were You Discharged? No data recorded  Have You Ever Received Services From [facility] Before? No data recorded Who Do You See at [facility]? No data recorded  Have You Recently Had Any Thoughts About Hurting Yourself? No  Are You Planning to Commit Suicide/Harm Yourself At This time? No   Have you Recently Had Thoughts About [place]? No  Explanation: No data recorded  Have You Used Any Alcohol or Drugs in the Past 24 Hours? -- (UTA)  How Long Ago Did You Use Drugs or Alcohol? No data recorded What Did You Use and How Much? No data recorded  Do You Currently Have a Therapist/Psychiatrist? -- (UTA)  Name of Therapist/Psychiatrist: No data recorded  Have You  Been Recently Discharged From Any Office Practice or Programs? No data recorded Explanation of Discharge From Practice/Program: No data recorded    CCA Screening Triage Referral Assessment Type of Contact: Face-to-Face  Is this Initial or Reassessment? No data recorded Date Telepsych consult ordered in [facility]:  No data recorded Time Telepsych consult ordered in [facility]:  No data recorded  Patient Reported  Information Reviewed? No data recorded Patient Left Without Being Seen? No data recorded Reason for Not Completing Assessment: No data recorded  Collateral Involvement: No data recorded  Does Patient Have a [place]? No data recorded Name and Contact of Legal Guardian: No data recorded If Minor and Not Living with Parent(s), Who has Custody? No data recorded Is CPS involved or ever been involved? Never  Is APS involved or ever been involved? Never   Patient Determined To Be At Risk for Harm To Self or Others Based on Review of Patient Reported Information or Presenting Complaint? No  Method: No data recorded Availability of Means: No data recorded Intent: No data recorded Notification Required: No data recorded Additional Information for Danger to Others Potential: No data recorded Additional Comments for Danger to Others Potential: No data recorded Are There Guns or Other Weapons in Your Home? No data recorded Types of Guns/Weapons: No data recorded Are These Weapons Safely Secured?                            No data recorded Who Could Verify You Are Able To Have These Secured: No data recorded Do You Have any Outstanding Charges, Pending Court Dates, Parole/Probation? No data recorded Contacted To Inform of Risk of Harm To Self or Others: No data recorded  Location of Assessment: [facility] ED   Does Patient Present under Involuntary Commitment? No  IVC Papers Initial File Date: No data recorded  [place] of Residence: [place]   Patient Currently Receiving the Following Services: No data recorded  Determination of Need: Emergent (2 hours)   Options For Referral: No data recorded    CCA Biopsychosocial Intake/Chief Complaint:  No data recorded Current Symptoms/Problems: No data recorded  Patient Reported Schizophrenia/Schizoaffective Diagnosis in Past: No   Strengths: Unable to assess  Preferences: No data recorded Abilities: No data  recorded  Type of Services Patient Feels are Needed: No data recorded  Initial Clinical Notes/Concerns: No data recorded  Mental Health Symptoms Depression:   -- (UTA)   Duration of Depressive symptoms: No data recorded  Mania:   -- (UTA)   Anxiety:    Difficulty concentrating   Psychosis:   Hallucinations   Duration of Psychotic symptoms:  -- (UTA)   Trauma:   -- (UTA)   Obsessions:   -- (UTA)   Compulsions:   -- (UTA)   Inattention:   -- (UTA)   Hyperactivity/Impulsivity:   -- (UTA)   Oppositional/Defiant Behaviors:  No data recorded  Emotional Irregularity:   -- (UTA)   Other Mood/Personality Symptoms:  No data recorded   Mental Status Exam Appearance and self-care  Stature:   Average   Weight:   Overweight   Clothing:   Disheveled   Grooming:   Neglected   Cosmetic use:   None   Posture/gait:   Slumped   Motor activity:   Restless   Sensorium  Attention:   Confused   Concentration:   Scattered   Orientation:   Person   Recall/memory:   Defective in Recent  Affect and Mood  Affect:   Anxious   Mood:   Anxious   Relating  Eye contact:   Fleeting   Facial expression:   Anxious   Attitude toward examiner:   Cooperative   Thought and Language  Speech flow:  Slurred   Thought content:   Delusions   Preoccupation:   None   Hallucinations:   Visual   Organization:  No data recorded  [employer] of Knowledge:   Fair   Intelligence:   Average   Abstraction:   Functional   Judgement:   Impaired   Reality Testing:   Distorted; Unaware   Insight:   Poor   Decision Making:   Impulsive   Social Functioning  Social Maturity:   Impulsive   Social Judgement:   Heedless   Stress  Stressors:  No data recorded  Coping Ability:   Exhausted   Skill Deficits:   None   Supports:   Family     Religion: Religion/Spirituality Are You A Religious Person?:   Special educational needs teacher)  Leisure/Recreation: Leisure / Recreation Do You Have Hobbies?:  (UTA)  Exercise/Diet: Exercise/Diet Do You Exercise?:  (UTA) Have You Gained or Lost A Significant Amount of Weight in the Past Six Months?:  (UTA) Do You Follow a Special Diet?:  (UTA) Do You Have Any Trouble Sleeping?: No   CCA Employment/Education Employment/Work Situation: Employment / Work Situation Employment Situation:  Special educational needs teacher)  Education: Education Is Patient Currently Attending School?: No Did You Have An Individualized Education Program (IIEP): No Did You Have Any Difficulty At [employer]?: No Patient's Education Has Been Impacted by Current Illness: No   CCA Family/Childhood History Family and Relationship History: Family history Marital status: Married What types of issues is patient dealing with in the relationship?: None reported Additional relationship information: Unknown Does patient have children?:  (UTA)  Childhood History:  Childhood History Did patient suffer any verbal/emotional/physical/sexual abuse as a child?:  (UTA) Did patient suffer from severe childhood neglect?:  (UTA) Has patient ever been sexually abused/assaulted/raped as an adolescent or adult?:  (UTA) Was the patient ever a victim of a crime or a disaster?:  (UTA) Witnessed domestic violence?:  (UTA) Has patient been affected by domestic violence as an adult?:  Special educational needs teacher)  Child/Adolescent Assessment:     CCA Substance Use Alcohol/Drug Use: Alcohol / Drug Use Pain Medications: See MAR Prescriptions: See MAR Over the Counter: See MAR History of alcohol / drug use?: Yes Substance #1 Name of Substance 1: Alcohol                       ASAM's:  Six Dimensions of Multidimensional Assessment  Dimension 1:  Acute Intoxication and/or Withdrawal Potential:      Dimension 2:  Biomedical Conditions and Complications:      Dimension 3:  Emotional, Behavioral, or Cognitive Conditions and Complications:      Dimension 4:  Readiness to Change:     Dimension 5:  Relapse, Continued use, or Continued Problem Potential:     Dimension 6:  Recovery/Living Environment:     ASAM Severity Score:    ASAM Recommended Level of Treatment:     Substance use Disorder (SUD)    Recommendations for Services/Supports/Treatments:  Disposition pending  DSM5 Diagnoses: Patient Active Problem List   Diagnosis Date Noted   Thiamine deficiency 09/20/2020   Ataxia 06/18/2020   Leg weakness, bilateral 06/18/2020   Obesity (BMI 30.0-34.9) 01/09/2020   Macrocytosis 10/08/2019  Depression, major, single episode, moderate (HCC) 06/13/2019   Chronic alcohol use 06/13/2019   Right shoulder pain 06/13/2019   BRBPR (bright red blood per rectum) 07/26/2016   Elevated LFTs 02/18/2016   Benign essential HTN 07/26/2015   Lower extremity pain, left 05/19/2015   Obstructive sleep apnea 04/10/2008   ABNORMAL HEART RHYTHMS 04/10/2008    Patient Centered Plan: Patient is on the following Treatment Plan(s):  Depression and Substance Abuse   Referrals to Alternative Service(s): Referred to Alternative Service(s):   Place:   Date:   Time:    Referred to Alternative Service(s):   Place:   Date:   Time:    Referred to Alternative Service(s):   Place:   Date:   Time:    Referred to Alternative Service(s):   Place:   Date:   Time:     Braxxton Stoudt A Lucita Montoya, LCAS-A

## 2021-02-17 NOTE — ED Notes (Signed)
Pt speaking clearly with some slurring of words. Pt oriented to self, situation, and date, disoriented to place

## 2021-02-17 NOTE — ED Notes (Signed)
Resumed care from Narcissa rn.  Wife in with pt.

## 2021-02-17 NOTE — ED Provider Notes (Signed)
[facility] Emergency Department Provider Note   ____________________________________________   Event Date/Time   First MD Initiated Contact with Patient 02/17/21 1646     (approximate)  I have reviewed the triage vital signs and the nursing notes.   HISTORY  Chief Complaint Altered Mental Status History limited by altered mental status a lot of it was obtained from the wife.  HPI Stuart Bruce is a 60 y.o. male who comes in from home.  He was to go back to alcohol rehab in [place] today and the patient's son-in-law was with him to make sure he got on the plane on time because he is previously delayed and missed several flights.  Son-in-law reports the patient was on the computer and slumped over.  On arrival here patient was very difficult to arouse initially and then had very slurry speech and seemed to have a right-sided facial droop.  Wife said this was new.  He has never had this before.  His code stroke was called.  Patient was noted to have slumped over around about 3:00.  CT and CT angio was done.  Patient begins waking up more and says he just relapsed.  Facial droop went away CTs were negative.  Neurology feels this is some most likely to be intoxication.  We are still waiting on the alcohol level.  Patient's wife reports he has been talking a lot about suicide and his family being better off if he was there.        Past Medical History:  Diagnosis Date   Depression    Hypertension    OSA (obstructive sleep apnea)    no CPAP use    Patient Active Problem List   Diagnosis Date Noted   Thiamine deficiency 09/20/2020   Ataxia 06/18/2020   Leg weakness, bilateral 06/18/2020   Obesity (BMI 30.0-34.9) 01/09/2020   Macrocytosis 10/08/2019   Depression, major, single episode, moderate (HCC) 06/13/2019   Chronic alcohol use 06/13/2019   Right shoulder pain 06/13/2019   BRBPR (bright red blood per rectum) 07/26/2016   Elevated LFTs 02/18/2016    Benign essential HTN 07/26/2015   Lower extremity pain, left 05/19/2015   Obstructive sleep apnea 04/10/2008   ABNORMAL HEART RHYTHMS 04/10/2008    Past Surgical History:  Procedure Laterality Date   HAND SURGERY Right 2005   Cyst on ring finger    Prior to Admission medications   Medication Sig Start Date End Date Taking? Authorizing Provider  ASPIRIN LOW DOSE 81 MG chewable tablet Chew 81 mg by mouth daily. 01/24/21  Yes [provider]  escitalopram (LEXAPRO) 10 MG tablet TAKE 1 TABLET(10 MG) BY MOUTH DAILY 09/20/20  Yes Leone Haven, MD  lisinopril-hydrochlorothiazide (ZESTORETIC) 20-12.5 MG tablet Take 1 tablet by mouth daily. 01/19/21  Yes [provider]  naltrexone (DEPADE) 50 MG tablet Take 50 mg by mouth daily. 01/17/21  Yes [provider]  Ascorbic Acid (VITAMIN C) 1000 MG tablet Take 1,000 mg by mouth daily. Patient not taking: No sig reported    [provider]  B Complex Vitamins (B COMPLEX 100 PO) Take 1 capsule by mouth daily. Patient not taking: No sig reported    [provider]  Coenzyme Q10 (CO Q-10) 200 MG CAPS Take 1 capsule by mouth daily. Patient not taking: No sig reported    [provider]  doxazosin (CARDURA) 8 MG tablet TAKE 1 TABLET(8 MG) BY MOUTH DAILY Patient not taking: Reported on 02/17/2021 09/20/20  Leone Haven, MD  fosinopril (MONOPRIL) 20 MG tablet TAKE 1 TABLET(20 MG) BY MOUTH DAILY Patient not taking: No sig reported 09/20/20   Leone Haven, MD  Maca Root (MACA PO) Take 525 mg by mouth daily. Patient not taking: Reported on 02/17/2021    [provider]  Milk Thistle 300 MG CAPS Take 1 capsule by mouth daily. Patient not taking: No sig reported    [provider]    Allergies Patient has no known allergies.  Family History  Problem Relation Age of Onset   Cancer Mother        Breast   Diabetes Father    Cancer Father        Renal   Drug abuse  Paternal Aunt    Colon cancer Neg Hx     Social History Social History   Tobacco Use   Smoking status: Never   Smokeless tobacco: Never  Substance Use Topics   Alcohol use: Yes    Alcohol/week: 29.0 standard drinks    Types: 14 Glasses of wine, 14 Cans of beer, 1 Shots of liquor per week    Comment: 2-4 drinks daily beer/wine, & occ. 1 shot liquor   Drug use: No    Review of Systems Unable to obtain due to altered mental status  ____________________________________________   PHYSICAL EXAM:  VITAL SIGNS: ED Triage Vitals  Enc Vitals Group     BP 02/17/21 1644 (!) 141/97     Pulse Rate 02/17/21 1644 (!) 59     Resp 02/17/21 1644 (!) 22     Temp 02/17/21 1644 97.6 F (36.4 C)     Temp Source 02/17/21 1644 Axillary     SpO2 02/17/21 1644 97 %     Weight 02/17/21 1645 220 lb (99.8 kg)     Height 02/17/21 1645 '5\' 10"'$  (1.778 m)     Head Circumference --      Peak Flow --      Pain Score --      Pain Loc --      Pain Edu? --      Excl. in [place]? --     Constitutional: Groggy and hard to arouse Eyes: Conjunctivae are normal. PER initially patient did not seem to be able to look to the right later he did.  The time between not being able to and being able to was only about a minute. Head: Atraumatic. Nose: No congestion/rhinnorhea. Mouth/Throat: Mucous membranes are moist.  Oropharynx non-erythematous. Neck: No stridor.   Cardiovascular: Normal rate, regular rhythm. Grossly normal heart sounds.  Good peripheral circulation. Respiratory: Normal respiratory effort.  No retractions. Lungs CTAB. Gastrointestinal: Soft and nontender. No distention. No abdominal bruits. Musculoskeletal: No lower extremity tenderness nor edema.   Neurologic: Speech initially very slurry on arrival to the ER.  Patient was able to squeeze my hands on both sides and move both feet.  He could not do much besides that initially now he is moving everything equally and well but still weak. Skin:  Skin  is warm, dry and intact. No rash noted.   ____________________________________________   LABS (all labs ordered are listed, but only abnormal results are displayed)  Labs Reviewed  LACTIC ACID, PLASMA - Abnormal; Notable for the following components:      Result Value   Lactic Acid, Venous 3.1 (*)    All other components within normal limits  CBC WITH DIFFERENTIAL/PLATELET - Abnormal; Notable for the following components:   MCV  101.5 (*)    MCH 36.0 (*)    All other components within normal limits  BLOOD GAS, ARTERIAL - Abnormal; Notable for the following components:   pO2, Arterial 110 (*)    All other components within normal limits  COMPREHENSIVE METABOLIC PANEL - Abnormal; Notable for the following components:   Glucose, Bld 140 (*)    Calcium 8.3 (*)    All other components within normal limits  ACETAMINOPHEN LEVEL - Abnormal; Notable for the following components:   Acetaminophen (Tylenol), Serum <10 (*)    All other components within normal limits  ETHANOL - Abnormal; Notable for the following components:   Alcohol, Ethyl (B) 469 (*)    All other components within normal limits  SALICYLATE LEVEL - Abnormal; Notable for the following components:   Salicylate Lvl [id number] (*)    All other components within normal limits  CBG MONITORING, ED - Abnormal; Notable for the following components:   Glucose-Capillary 128 (*)    All other components within normal limits  RESP PANEL BY RT-PCR (FLU A&B, COVID) ARPGX2  LIPASE, BLOOD  PROTIME-INR  APTT  BRAIN NATRIURETIC PEPTIDE  URINALYSIS, COMPLETE (UACMP) WITH MICROSCOPIC  URINE DRUG SCREEN, QUALITATIVE ([facility] ONLY)  LACTIC ACID, PLASMA  TROPONIN I (HIGH SENSITIVITY)  TROPONIN I (HIGH SENSITIVITY)   ____________________________________________  EKG   ____________________________________________  RADIOLOGY Gertha Calkin, personally viewed and evaluated these images (plain radiographs) as part of my medical decision  making, as well as reviewing the written report by the radiologist.  ED MD interpretation:    Official radiology report(s): CT of the chest read by radiology as normal.  I am not so sure if there is not something in the right middle lobe.  Patient has no symptoms currently.  CT of the head with and without contrast angio are negative for any acute pathology DG Chest Port 1 View  Result Date: 02/17/2021 CLINICAL DATA:  Altered level of consciousness EXAM: PORTABLE CHEST 1 VIEW COMPARISON:  06/21/2006 FINDINGS: Single frontal view of the chest demonstrates an unremarkable cardiac silhouette. No airspace disease, effusion, or pneumothorax. No acute bony abnormalities. IMPRESSION: 1. No acute intrathoracic process. Electronically Signed   By: Randa Ngo M.D.   On: 02/17/2021 18:41   CT HEAD CODE STROKE WO CONTRAST`  Result Date: 02/17/2021 CLINICAL DATA:  Code stroke.  Cerebral hemorrhage suspected EXAM: CT HEAD WITHOUT CONTRAST TECHNIQUE: Contiguous axial images were obtained from the base of the skull through the vertex without intravenous contrast. COMPARISON:  12/24/2020. FINDINGS: Brain: No evidence of acute large vascular territory infarction, hemorrhage, hydrocephalus, extra-axial collection or mass lesion/mass effect. Similar remote lacunar infarct in the anterior aspect of the left internal capsule. Similar additional mild scattered white matter hypodensities, nonspecific but compatible with chronic microvascular ischemic disease. Similar moderate atrophy with ex vacuo ventricular dilation and prominent extra-axial spaces. Vascular: No hyperdense vessel identified. Calcific intracranial atherosclerosis. Skull: No acute fracture. Sinuses/Orbits: Clear sinuses.  No acute orbital findings. ASPECTS [facility] Stroke Program Early CT Score) total score (0-10 with 10 being normal): 10 IMPRESSION: 1. No evidence of acute large vascular territory infarct or acute hemorrhage. ASPECTS is 10. 2. Remote  lacunar infarct in the left external capsule. Mild chronic microvascular ischemic disease. 3. Moderate atrophy, age advanced. Code stroke imaging results were communicated on 02/17/2021 at 5:14 pm to provider [facility] via telephone, who verbally acknowledged these results. Electronically Signed   By: Margaretha Sheffield M.D.   On: 02/17/2021 17:18  CT ANGIO HEAD NECK W WO CM (CODE STROKE)  Result Date: 02/17/2021 CLINICAL DATA:  Stroke, follow up EXAM: CT ANGIOGRAPHY HEAD AND NECK TECHNIQUE: Multidetector CT imaging of the head and neck was performed using the standard protocol during bolus administration of intravenous contrast. Multiplanar CT image reconstructions and MIPs were obtained to evaluate the vascular anatomy. Carotid stenosis measurements (when applicable) are obtained utilizing NASCET criteria, using the distal internal carotid diameter as the denominator. CONTRAST:  27m OMNIPAQUE IOHEXOL 350 MG/ML SOLN COMPARISON:  None. FINDINGS: CTA NECK FINDINGS Aortic arch: Great vessel origins are patent. Right carotid system: Patent. Mild carotid bifurcation atherosclerosis without greater than 50% stenosis. Left carotid system: Patent. Motion limited evaluation without evidence of significant (greater than 50%) stenosis. Mild carotid bifurcation atherosclerosis. Tortuous ICA at the skull base. Vertebral arteries: Nondiagnostic evaluation of the vertebral arteries in the mid to lower neck due to extensive streak artifact from dense contrast within paraspinal veins (see below). The vertebral arteries are patent in the upper neck. Skeleton: Mild multilevel degenerative change. Other neck: Narrowing of the left brachiocephalic vein as it courses between the sternum and aorta with extensive reflux of contrast into small veins within the neck. Upper chest: Clear visualized lung apices. Review of the MIP images confirms the above findings CTA HEAD FINDINGS Anterior circulation: Bilateral intracranial ICAs, MCAs, and  ACAs are patent without proximal hemodynamically significant stenosis. Mild calcific atherosclerosis of bilateral ICAs. The right ACA is larger than left throughout, likely anatomic variation. No aneurysm identified. Posterior circulation: Mild narrowing of the proximal intradural vertebral arteries from calcific atherosclerosis bilaterally. Bilateral intradural vertebral arteries, basilar artery, and posterior cerebral arteries are patent. Moderate stenosis of the right P1 PCA distally. No aneurysm identified. Venous sinuses: As permitted by contrast timing, patent. Anatomic variants: As detailed above. Review of the MIP images confirms the above findings IMPRESSION: CTA Head: 1. No large vessel occlusion. 2. Moderate right P1 PCA stenosis. CTA Neck: 1. No significant (greater than 50%) stenosis of the carotids bilaterally. 2. Nondiagnostic evaluation of the vertebral arteries in the lower to mid neck due to extensive streak artifact from refluxed contrast in paraspinal veins, probably due to narrowing of the brachiocephalic vein. Bilateral vertebral arteries are patent in the upper neck. Electronically Signed   By: FMargaretha SheffieldM.D.   On: 02/17/2021 18:20    ____________________________________________   PROCEDURES  Procedure(s) performed (including Critical Care): Critical care time 20 minutes.  This includes examining the patient in the hallway and then reexamining him in room.  I also helped to get him to CT and spoke with teleneurology on the monitor and the neurologist on-call here on the telephone.  I have also discussed the patient with TTS as I mention below.  I spent a good deal of time talking to the patient's wife.  She provided me with most of the history.  Procedures   ____________________________________________   INITIAL IMPRESSION / ASSESSMENT AND PLAN / ED COURSE ----------------------------------------- 6:55 PM on 02/17/2021 -----------------------------------------  I  also spoke with TTS and let them know about this patient. Labs are still pending at this point.  And I anticipate since the patient has now said he has had a relapse that the alcohol level will be high.    ----------------------------------------- 11:51 PM on 02/17/2021 ----------------------------------------- Patient doing well.  He is now awake and alert.  No focal neurological findings.  He apparently was just drunk.  I have consulted psych and TTS.  His wife has reported  that he was having suicidal ideation.  I have not committed him as he has been cooperative.  I recommend if he wants to leave before psych clears him that he would be committed.         ____________________________________________   FINAL CLINICAL IMPRESSION(S) / ED DIAGNOSES  Final diagnoses:  Alcoholic intoxication without complication ([place])  Suicidal ideation     ED Discharge Orders     None        Note:  This document was prepared using Dragon voice recognition software and may include unintentional dictation errors.    Nena Polio, MD 02/17/21 2352

## 2021-02-17 NOTE — ED Notes (Signed)
Pt returned from [place], lab called again to collect labs. Pt increasingly responsive, words slurred.

## 2021-02-17 NOTE — ED Notes (Signed)
Lab called to collect labs  

## 2021-02-17 NOTE — Consult Note (Signed)
PHARMACIST CODE STROKE RESPONSE  NO TNK mixed.   Obtained TNK from pyxis but it was not used. On going assessment from neurologist and ED providers.    Stuart Bruce 02/17/21 6:13 PM

## 2021-02-17 NOTE — ED Notes (Signed)
Pt's wife took pt's phone home

## 2021-02-17 NOTE — ED Notes (Signed)
Pt difficult stick, IV access from EMS infiltrated, Jaque RN with 2 attempts to start IV. Some blood collected but then No blood return, flushes well, lab called to collect blood, but then pt sent to CT angio.

## 2021-02-17 NOTE — ED Triage Notes (Signed)
Pt from home, pt discharged from rehab 2 weeks ago, supposed to leave today for another rehab for ETOH. Pt found with AMS per family at 1515, responsive to pain. Pt found with ETOH smell on his breath and has been taking naltrexone but report is unclear if he took any today. Pt snoring on arrival, responsive to pain, opening eyes, but then going back to sleep.

## 2021-02-18 DIAGNOSIS — Z20822 Contact with and (suspected) exposure to covid-19: Secondary | ICD-10-CM | POA: Diagnosis not present

## 2021-02-18 DIAGNOSIS — I639 Cerebral infarction, unspecified: Secondary | ICD-10-CM | POA: Diagnosis not present

## 2021-02-18 DIAGNOSIS — F10120 Alcohol abuse with intoxication, uncomplicated: Secondary | ICD-10-CM | POA: Diagnosis not present

## 2021-02-18 DIAGNOSIS — Z7982 Long term (current) use of aspirin: Secondary | ICD-10-CM | POA: Diagnosis not present

## 2021-02-18 DIAGNOSIS — R29810 Facial weakness: Secondary | ICD-10-CM | POA: Diagnosis not present

## 2021-02-18 DIAGNOSIS — F329 Major depressive disorder, single episode, unspecified: Secondary | ICD-10-CM | POA: Diagnosis not present

## 2021-02-18 DIAGNOSIS — Z79899 Other long term (current) drug therapy: Secondary | ICD-10-CM | POA: Diagnosis not present

## 2021-02-18 DIAGNOSIS — R4781 Slurred speech: Secondary | ICD-10-CM | POA: Diagnosis not present

## 2021-02-18 DIAGNOSIS — I10 Essential (primary) hypertension: Secondary | ICD-10-CM | POA: Diagnosis not present

## 2021-02-18 DIAGNOSIS — F1010 Alcohol abuse, uncomplicated: Secondary | ICD-10-CM

## 2021-02-18 LAB — URINALYSIS, COMPLETE (UACMP) WITH MICROSCOPIC
Bacteria, UA: NONE SEEN
Bilirubin Urine: NEGATIVE
Glucose, UA: NEGATIVE mg/dL
Hgb urine dipstick: NEGATIVE
Ketones, ur: NEGATIVE mg/dL
Leukocytes,Ua: NEGATIVE
Nitrite: NEGATIVE
Protein, ur: NEGATIVE mg/dL
Specific Gravity, Urine: 1.017 (ref 1.005–1.030)
Squamous Epithelial / HPF: NONE SEEN (ref 0–5)
pH: 6 (ref 5.0–8.0)

## 2021-02-18 LAB — PHOSPHORUS: Phosphorus: 2.6 mg/dL (ref 2.5–4.6)

## 2021-02-18 LAB — URINE DRUG SCREEN, QUALITATIVE (ARMC ONLY)
Amphetamines, Ur Screen: NOT DETECTED
Barbiturates, Ur Screen: NOT DETECTED
Benzodiazepine, Ur Scrn: POSITIVE — AB
Cannabinoid 50 Ng, Ur ~~LOC~~: NOT DETECTED
Cocaine Metabolite,Ur ~~LOC~~: NOT DETECTED
MDMA (Ecstasy)Ur Screen: NOT DETECTED
Methadone Scn, Ur: NOT DETECTED
Opiate, Ur Screen: NOT DETECTED
Phencyclidine (PCP) Ur S: NOT DETECTED
Tricyclic, Ur Screen: NOT DETECTED

## 2021-02-18 LAB — ETHANOL: Alcohol, Ethyl (B): 229 mg/dL — ABNORMAL HIGH (ref <10)

## 2021-02-18 LAB — MAGNESIUM: Magnesium: 2.9 mg/dL — ABNORMAL HIGH (ref 1.7–2.4)

## 2021-02-18 MED ORDER — LORAZEPAM 2 MG/ML IJ SOLN
0.0000 mg | Freq: Four times a day (QID) | INTRAMUSCULAR | Status: DC
  Administered 2021-02-18: 2 mg via INTRAVENOUS
  Filled 2021-02-18: qty 1

## 2021-02-18 MED ORDER — ADULT MULTIVITAMIN W/MINERALS CH
1.0000 | ORAL_TABLET | Freq: Every day | ORAL | Status: DC
  Administered 2021-02-18: 1 via ORAL
  Filled 2021-02-18: qty 1

## 2021-02-18 MED ORDER — FOLIC ACID 1 MG PO TABS
1.0000 mg | ORAL_TABLET | Freq: Every day | ORAL | Status: DC
  Administered 2021-02-18: 1 mg via ORAL
  Filled 2021-02-18: qty 1

## 2021-02-18 MED ORDER — THIAMINE HCL 100 MG/ML IJ SOLN: 100.0000 mg | Freq: Every day | INTRAMUSCULAR | Status: DC

## 2021-02-18 MED ORDER — LORAZEPAM 2 MG PO TABS
0.0000 mg | ORAL_TABLET | Freq: Two times a day (BID) | ORAL | Status: DC
Start: 2021-02-20 — End: 2021-02-19

## 2021-02-18 MED ORDER — MAGNESIUM SULFATE IN D5W 1-5 GM/100ML-% IV SOLN
1.0000 g | Freq: Once | INTRAVENOUS | Status: AC
  Administered 2021-02-18: 1 g via INTRAVENOUS
  Filled 2021-02-18: qty 100

## 2021-02-18 MED ORDER — LORAZEPAM 2 MG/ML IJ SOLN: 0.0000 mg | Freq: Two times a day (BID) | INTRAMUSCULAR | Status: DC

## 2021-02-18 MED ORDER — SODIUM CHLORIDE 0.9 % IV SOLN: INTRAVENOUS | Status: DC

## 2021-02-18 MED ORDER — THIAMINE HCL 100 MG PO TABS
100.0000 mg | ORAL_TABLET | Freq: Every day | ORAL | Status: DC
  Administered 2021-02-18: 100 mg via ORAL
  Filled 2021-02-18: qty 1

## 2021-02-18 MED ORDER — LORAZEPAM 2 MG PO TABS
0.0000 mg | ORAL_TABLET | Freq: Four times a day (QID) | ORAL | Status: DC
  Administered 2021-02-18: 1 mg via ORAL
  Filled 2021-02-18: qty 1

## 2021-02-18 NOTE — BH Assessment (Signed)
Spoke with the patient to complete an updated/reassessment. He denies SI/HI and AV/H. Admits to the use of alcohol and it has been a problem for him since 1992. Patient states he was suppose to go to a rehab facility and his son-n-law was going take him to the airport. However, prior to the son-n-law getting there, he drank the last of his alcohol. When he arrived the son found him in the chair at the computer. Patient states she doesn't remember many of the events from the house to the ER.  With the permission of the patient, writer spoke with the patient's wife. She shared she had no concerns about discharging, other than detox from the alcohol. She also shared his bed at the facility was still available.

## 2021-02-18 NOTE — ED Provider Notes (Signed)
Emergency department handoff note  Care of this patient was signed out to me at the end of the previous provider shift.  All pertinent patient information was conveyed and all questions were answered.  Patient pending a ride to bring him home.  Per the last psychiatry note, patient is stable and safe for discharge at this time as long as they have arranged follow-up at a detox facility.  Patient's wife stated that she can pick him up as well as has plane tickets to take him to his detox facility.  The patient has been reexamined and is ready to be discharged.  All diagnostic results have been reviewed and discussed with the patient/family.  Care plan has been outlined and the patient/family understands all current diagnoses, results, and treatment plans.  There are no new complaints, changes, or physical findings at this time.  All questions have been addressed and answered.  Patient was instructed to, and agrees to follow-up with their primary care physician as well as return to the emergency department if any new or worsening symptoms develop.   Naaman Plummer, MD 02/18/21 2125

## 2021-02-18 NOTE — ED Notes (Signed)
Stuart Bruce, with TTS, is at bedside.

## 2021-02-18 NOTE — Consult Note (Signed)
[facility] Face-to-Face Psychiatry Consult   Reason for Consult: Consult for 60 year old man came to the hospital very intoxicated Referring Physician: Corky Downs Patient Identification: Stuart Bruce MRN:  [id number] Principal Diagnosis: Alcohol abuse Diagnosis:  Principal Problem:   Alcohol abuse Active Problems:   Major depression   Total Time spent with patient: 1 hour  Subjective:   Stuart Bruce is a 60 y.o. male patient admitted with "I am a drunk of epic proportions".  HPI: Patient seen chart reviewed.  60 year old man comes to the emergency room voluntarily with a blood alcohol level of 469.  Says that he had been drinking large amounts of alcohol whiskey in particular of higher than usual potency last night.  He had been planning to get on a plane to go to a detox program in [place] patient was found by family passed out and brought to the emergency room.  Patient was just in a rehab program about 30 days ago relapsed immediately after coming home.  Drinking heavily every day.  Denies use of other drugs.  Mood is been feeling depressed.  He has been having suicidal thoughts and has even looked into some possible methods of suicide.  Denies any psychosis or hallucinations.  No homicidal ideation.  Continues to state a preference to go to inpatient treatment.  Past Psychiatric History: History of alcohol abuse sounds like things have gotten much worse over the last couple years.  Has had inpatient detox before.  Struggles to maintain sobriety.  Has never had a seizure with withdrawal and has never had delirium tremens.  Does not abuse other drugs.  No prior suicide attempts.  Risk to Self:   Risk to Others:   Prior Inpatient Therapy:   Prior Outpatient Therapy:    Past Medical History:  Past Medical History:  Diagnosis Date   Depression    Hypertension    OSA (obstructive sleep apnea)    no CPAP use    Past Surgical History:  Procedure Laterality Date   HAND SURGERY Right 2005   Cyst  on ring finger   Family History:  Family History  Problem Relation Age of Onset   Cancer Mother        Breast   Diabetes Father    Cancer Father        Renal   Drug abuse Paternal Aunt    Colon cancer Neg Hx    Family Psychiatric  History: Positive for multiple people in the family with alcohol problems Social History:  Social History   Substance and Sexual Activity  Alcohol Use Yes   Alcohol/week: 29.0 standard drinks   Types: 14 Glasses of wine, 14 Cans of beer, 1 Shots of liquor per week   Comment: 2-4 drinks daily beer/wine, & occ. 1 shot liquor     Social History   Substance and Sexual Activity  Drug Use No    Social History   Socioeconomic History   Marital status: Married    Spouse name: Not on file   Number of children: Not on file   Years of education: Not on file   Highest education level: Not on file  Occupational History   Not on file  Tobacco Use   Smoking status: Never   Smokeless tobacco: Never  Substance and Sexual Activity   Alcohol use: Yes    Alcohol/week: 29.0 standard drinks    Types: 14 Glasses of wine, 14 Cans of beer, 1 Shots of liquor per week    Comment:  2-4 drinks daily beer/wine, & occ. 1 shot liquor   Drug use: No   Sexual activity: Yes    Partners: Female    Comment: Wife- post-menopausal   Other Topics Concern   Not on file  Social History Narrative   Works for [employer] in [place] with wife and two daughters   Pets: 1 dog lives inside    Caffeine- 2 cups of coffee, no soda/tea    Enjoys playing golf, reading   Right handed   Social Determinants of Health   Financial Resource Strain: Not on file  Food Insecurity: Not on file  Transportation Needs: Not on file  Physical Activity: Not on file  Stress: Not on file  Social Connections: Not on file   Additional Social History:    Allergies:  No Known Allergies  Labs:  Results for orders placed or performed during the hospital encounter of 02/17/21 (from the  past 48 hour(s))  Urinalysis, Complete w Microscopic Urine, Random     Status: Abnormal   Collection Time: 02/17/21 11:09 AM  Result Value Ref Range   Color, Urine YELLOW (A) YELLOW   APPearance CLEAR (A) CLEAR   Specific Gravity, Urine 1.017 1.005 - 1.030   pH 6.0 5.0 - 8.0   Glucose, UA NEGATIVE NEGATIVE mg/dL   Hgb urine dipstick NEGATIVE NEGATIVE   Bilirubin Urine NEGATIVE NEGATIVE   Ketones, ur NEGATIVE NEGATIVE mg/dL   Protein, ur NEGATIVE NEGATIVE mg/dL   Nitrite NEGATIVE NEGATIVE   Leukocytes,Ua NEGATIVE NEGATIVE   RBC / HPF 0-5 0 - 5 RBC/hpf   WBC, UA 0-5 0 - 5 WBC/hpf   Bacteria, UA NONE SEEN NONE SEEN   Squamous Epithelial / LPF NONE SEEN 0 - 5   Mucus PRESENT     Comment: Performed at [facility], [address], [place], [place] [address]  Urine Drug Screen, Qualitative     Status: Abnormal   Collection Time: 02/17/21 11:09 AM  Result Value Ref Range   Tricyclic, Ur Screen NONE DETECTED NONE DETECTED   Amphetamines, Ur Screen NONE DETECTED NONE DETECTED   MDMA (Ecstasy)Ur Screen NONE DETECTED NONE DETECTED   Cocaine Metabolite,Ur [place] NONE DETECTED NONE DETECTED   Opiate, Ur Screen NONE DETECTED NONE DETECTED   Phencyclidine (PCP) Ur S NONE DETECTED NONE DETECTED   Cannabinoid 50 Ng, Ur [place] NONE DETECTED NONE DETECTED   Barbiturates, Ur Screen NONE DETECTED NONE DETECTED   Benzodiazepine, Ur Scrn POSITIVE (A) NONE DETECTED   Methadone Scn, Ur NONE DETECTED NONE DETECTED    Comment: (NOTE) Tricyclics + metabolites, urine    Cutoff 1000 ng/mL Amphetamines + metabolites, urine  Cutoff 1000 ng/mL MDMA (Ecstasy), urine              Cutoff 500 ng/mL Cocaine Metabolite, urine          Cutoff 300 ng/mL Opiate + metabolites, urine        Cutoff 300 ng/mL Phencyclidine (PCP), urine         Cutoff 25 ng/mL Cannabinoid, urine                 Cutoff 50 ng/mL Barbiturates + metabolites, urine  Cutoff 200 ng/mL Benzodiazepine, urine              Cutoff 200  ng/mL Methadone, urine                   Cutoff 300 ng/mL  The urine drug screen provides  only a preliminary, unconfirmed analytical test result and should not be used for non-medical purposes. Clinical consideration and professional judgment should be applied to any positive drug screen result due to possible interfering substances. A more specific alternate chemical method must be used in order to obtain a confirmed analytical result. Gas chromatography / mass spectrometry (GC/MS) is the preferred confirm atory method. Performed at [facility], [address] [place], [place] [address]   CBC with Differential     Status: Abnormal   Collection Time: 02/17/21  5:27 PM  Result Value Ref Range   WBC 4.6 4.0 - 10.5 K/uL   RBC 4.67 4.22 - 5.81 MIL/uL   Hemoglobin 16.8 13.0 - 17.0 g/dL   HCT 47.4 39.0 - 52.0 %   MCV 101.5 (H) 80.0 - 100.0 fL   MCH 36.0 (H) 26.0 - 34.0 pg   MCHC 35.4 30.0 - 36.0 g/dL   RDW 13.1 11.5 - 15.5 %   Platelets 166 150 - 400 K/uL   nRBC 0.0 0.0 - 0.2 %   Neutrophils Relative % 53 %   Neutro Abs 2.4 1.7 - 7.7 K/uL   Lymphocytes Relative 33 %   Lymphs Abs 1.5 0.7 - 4.0 K/uL   Monocytes Relative 8 %   Monocytes Absolute 0.4 0.1 - 1.0 K/uL   Eosinophils Relative 5 %   Eosinophils Absolute 0.3 0.0 - 0.5 K/uL   Basophils Relative 1 %   Basophils Absolute 0.0 0.0 - 0.1 K/uL   Immature Granulocytes 0 %   Abs Immature Granulocytes 0.01 0.00 - 0.07 K/uL    Comment: Performed at [facility], [address] [place], [place] [address]  Blood gas, arterial     Status: Abnormal   Collection Time: 02/17/21  5:27 PM  Result Value Ref Range   FIO2 0.21    pH, Arterial 7.43 7.350 - 7.450   pCO2 arterial 38 32.0 - 48.0 mmHg   pO2, Arterial 110 (H) 83.0 - 108.0 mmHg   Bicarbonate 25.2 20.0 - 28.0 mmol/L   Acid-Base Excess 1.0 0.0 - 2.0 mmol/L   O2 Saturation 98.4 %   Patient temperature 37.0    Collection site LEFT BRACHIAL    Sample type  ARTERIAL DRAW    Allens test (pass/fail) PASS PASS    Comment: Performed at [facility], [address] [place], [place] [address]  Comprehensive metabolic panel     Status: Abnormal   Collection Time: 02/17/21  5:29 PM  Result Value Ref Range   Sodium 139 135 - 145 mmol/L   Potassium 3.6 3.5 - 5.1 mmol/L   Chloride 103 98 - 111 mmol/L   CO2 26 22 - 32 mmol/L   Glucose, Bld 140 (H) 70 - 99 mg/dL    Comment: Glucose reference range applies only to samples taken after fasting for at least 8 hours.   BUN 8 6 - 20 mg/dL   Creatinine, Ser 0.74 0.61 - 1.24 mg/dL   Calcium 8.3 (L) 8.9 - 10.3 mg/dL   Total Protein 7.1 6.5 - 8.1 g/dL   Albumin 3.9 3.5 - 5.0 g/dL   AST 26 15 - 41 U/L   ALT 18 0 - 44 U/L   Alkaline Phosphatase 65 38 - 126 U/L   Total Bilirubin 1.0 0.3 - 1.2 mg/dL   GFR, Estimated >60 >60 mL/min    Comment: (NOTE) Calculated using the CKD-EPI Creatinine Equation (2021)    Anion gap 10 5 - 15  Comment: Performed at [facility], [address] [place], [place] [address]  Lipase, blood     Status: None   Collection Time: 02/17/21  5:29 PM  Result Value Ref Range   Lipase 41 11 - 51 U/L    Comment: Performed at [facility], [place], [place] [address]  Troponin I (High Sensitivity)     Status: None   Collection Time: 02/17/21  5:29 PM  Result Value Ref Range   Troponin I (High Sensitivity) 7 <18 ng/L    Comment: (NOTE) Elevated high sensitivity troponin I (hsTnI) values and significant  changes across serial measurements may suggest ACS but many other  chronic and acute conditions are known to elevate hsTnI results.  Refer to the "Links" section for chest pain algorithms and additional  guidance. Performed at [facility], [address] [place], [place] [address]   POC CBG, ED     Status: Abnormal   Collection Time: 02/17/21  5:45 PM  Result Value Ref Range   Glucose-Capillary 128 (H) 70 - 99 mg/dL     Comment: Glucose reference range applies only to samples taken after fasting for at least 8 hours.  Lactic acid, plasma     Status: Abnormal   Collection Time: 02/17/21  6:18 PM  Result Value Ref Range   Lactic Acid, Venous 3.1 (HH) 0.5 - 1.9 mmol/L    Comment: CRITICAL RESULT CALLED TO, READ BACK BY AND VERIFIED WITH MAUREEM TUMEY '@1855'$  ON 02/17/21 SKL Performed at [facility], [address] [place], [place] [address]   Protime-INR     Status: None   Collection Time: 02/17/21  6:19 PM  Result Value Ref Range   Prothrombin Time 13.3 11.4 - 15.2 seconds   INR 1.0 0.8 - 1.2    Comment: (NOTE) INR goal varies based on device and disease states. Performed at [facility], [address], [place] [address]   APTT     Status: None   Collection Time: 02/17/21  6:19 PM  Result Value Ref Range   aPTT 31 24 - 36 seconds    Comment: Performed at [facility], [address] [place], [place] [address]  Brain natriuretic peptide     Status: None   Collection Time: 02/17/21  6:19 PM  Result Value Ref Range   B Natriuretic Peptide 86.6 0.0 - 100.0 pg/mL    Comment: Performed at [facility], [place],  [address]  Acetaminophen level     Status: Abnormal   Collection Time: 02/17/21  6:19 PM  Result Value Ref Range   Acetaminophen (Tylenol), Serum <10 (L) 10 - 30 ug/mL    Comment: (NOTE) Therapeutic concentrations vary significantly. A range of 10-30 ug/mL  may be an effective concentration for many patients. However, some  are best treated at concentrations outside of this range. Acetaminophen concentrations >150 ug/mL at 4 hours after ingestion  and >50 ug/mL at 12 hours after ingestion are often associated with  toxic reactions.  Performed at [facility], [address] [place],  [address]   Ethanol     Status: Abnormal   Collection Time: 02/17/21  6:19 PM  Result Value Ref Range   Alcohol,  Ethyl (B) 469 (HH) <10 mg/dL    Comment: CRITICAL RESULT CALLED TO, READ BACK BY AND VERIFIED WITH MAUREEM TUMEY '@1855'$  ON 02/17/21 SKL (NOTE) Lowest detectable limit for serum alcohol is 10 mg/dL.  For medical purposes only. Performed at [facility], [place], [place] [id number]   Salicylate level     Status: Abnormal   Collection Time: 02/17/21  6:19 PM  Result Value Ref Range   Salicylate Lvl Q000111Q (L) 7.0 - 30.0 mg/dL    Comment: Performed at [facility], [place], [place] [address]  Troponin I (High Sensitivity)     Status: None   Collection Time: 02/17/21  6:20 PM  Result Value Ref Range   Troponin I (High Sensitivity) 7 <18 ng/L    Comment: (NOTE) Elevated high sensitivity troponin I (hsTnI) values and significant  changes across serial measurements may suggest ACS but many other  chronic and acute conditions are known to elevate hsTnI results.  Refer to the "Links" section for chest pain algorithms and additional  guidance. Performed at [facility], [place], [place] [address]   Resp Panel by RT-PCR (Flu A&B, Covid) Nasopharyngeal Swab     Status: None   Collection Time: 02/17/21  7:26 PM   Specimen: Nasopharyngeal Swab; Nasopharyngeal(NP) swabs in vial transport medium  Result Value Ref Range   SARS Coronavirus 2 by RT PCR NEGATIVE NEGATIVE    Comment: (NOTE) SARS-CoV-2 target nucleic acids are NOT DETECTED.  The SARS-CoV-2 RNA is generally detectable in upper respiratory specimens during the acute phase of infection. The lowest concentration of SARS-CoV-2 viral copies this assay can detect is 138 copies/mL. A negative result does not preclude SARS-Cov-2 infection and should not be used as the sole basis for treatment or other patient management decisions. A negative result may occur with  improper specimen collection/handling, submission of specimen other than nasopharyngeal swab, presence  of viral mutation(s) within the areas targeted by this assay, and inadequate number of viral copies(<138 copies/mL). A negative result must be combined with clinical observations, patient history, and epidemiological information. The expected result is Negative.  Fact Sheet for Patients:  EntrepreneurPulse.com.au  Fact Sheet for Healthcare Providers:  IncredibleEmployment.be  This test is no t yet approved or cleared by the [place] FDA and  has been authorized for detection and/or diagnosis of SARS-CoV-2 by FDA under an Emergency Use Authorization (EUA). This EUA will remain  in effect (meaning this test can be used) for the duration of the COVID-19 declaration under Section 564(b)(1) of the Act, 21 U.S.C.section 360bbb-3(b)(1), unless the authorization is terminated  or revoked sooner.       Influenza A by PCR NEGATIVE NEGATIVE   Influenza B by PCR NEGATIVE NEGATIVE    Comment: (NOTE) The Xpert Xpress SARS-CoV-2/FLU/RSV plus assay is intended as an aid in the diagnosis of influenza from Nasopharyngeal swab specimens and should not be used as a sole basis for treatment. Nasal washings and aspirates are unacceptable for Xpert Xpress SARS-CoV-2/FLU/RSV testing.  Fact Sheet for Patients: EntrepreneurPulse.com.au  Fact Sheet for Healthcare Providers: IncredibleEmployment.be  This test is not yet approved or cleared by the [place] FDA and has been authorized for detection and/or diagnosis of SARS-CoV-2 by FDA under an Emergency Use Authorization (EUA). This EUA will remain in effect (meaning this test can be used) for the duration of the COVID-19 declaration under Section 564(b)(1) of the Act, 21 U.S.C. section 360bbb-3(b)(1), unless the authorization is terminated or revoked.  Performed at [facility], [address], [place], [place] [address]   Magnesium     Status: Abnormal    Collection Time: 02/18/21 12:32 PM  Result  Value Ref Range   Magnesium 2.9 (H) 1.7 - 2.4 mg/dL    Comment: Performed at [facility], [address] [place], [place] [address]  Phosphorus     Status: None   Collection Time: 02/18/21 12:32 PM  Result Value Ref Range   Phosphorus 2.6 2.5 - 4.6 mg/dL    Comment: Performed at [facility], [address] [place], [place] [address]  Ethanol     Status: Abnormal   Collection Time: 02/18/21  1:04 PM  Result Value Ref Range   Alcohol, Ethyl (B) 229 (H) <10 mg/dL    Comment: (NOTE) Lowest detectable limit for serum alcohol is 10 mg/dL.  For medical purposes only. Performed at [facility], [address] [place], [place] [address]     Current Facility-Administered Medications  Medication Dose Route Frequency Provider Last Rate Last Admin   0.9 %  sodium chloride infusion  500 mL Intravenous Continuous Pyrtle, Lajuan Lines, MD       0.9 %  sodium chloride infusion   Intravenous Continuous Wynelle Cleveland, RPH 150 mL/hr at 02/18/21 1108 New Bag at [id number]   folic acid (FOLVITE) tablet 1 mg  1 mg Oral Daily Wynelle Cleveland, RPH   1 mg at 02/18/21 1108   LORazepam (ATIVAN) injection 0-4 mg  0-4 mg Intravenous Q6H Lavonia Drafts, MD       Or   LORazepam (ATIVAN) tablet 0-4 mg  0-4 mg Oral Q6H Lavonia Drafts, MD   1 mg at 02/18/21 1305   [START ON 02/20/2021] LORazepam (ATIVAN) injection 0-4 mg  0-4 mg Intravenous Pincus Large, MD       Or   Derrill Memo ON 02/20/2021] LORazepam (ATIVAN) tablet 0-4 mg  0-4 mg Oral Q12H Lavonia Drafts, MD       multivitamin with minerals tablet 1 tablet  1 tablet Oral Daily Wynelle Cleveland, RPH   1 tablet at 02/18/21 1300   thiamine tablet 100 mg  100 mg Oral Daily Lavonia Drafts, MD   100 mg at 02/18/21 1001   Or   thiamine (B-1) injection 100 mg  100 mg Intravenous Daily Lavonia Drafts, MD       Current Outpatient Medications  Medication Sig Dispense Refill   ASPIRIN  LOW DOSE 81 MG chewable tablet Chew 81 mg by mouth daily.     escitalopram (LEXAPRO) 10 MG tablet TAKE 1 TABLET(10 MG) BY MOUTH DAILY 90 tablet 1   lisinopril-hydrochlorothiazide (ZESTORETIC) 20-12.5 MG tablet Take 1 tablet by mouth daily.     naltrexone (DEPADE) 50 MG tablet Take 50 mg by mouth daily.     Ascorbic Acid (VITAMIN C) 1000 MG tablet Take 1,000 mg by mouth daily. (Patient not taking: No sig reported)     B Complex Vitamins (B COMPLEX 100 PO) Take 1 capsule by mouth daily. (Patient not taking: No sig reported)     Coenzyme Q10 (CO Q-10) 200 MG CAPS Take 1 capsule by mouth daily. (Patient not taking: No sig reported)     doxazosin (CARDURA) 8 MG tablet TAKE 1 TABLET(8 MG) BY MOUTH DAILY (Patient not taking: Reported on 02/17/2021) 90 tablet 3   fosinopril (MONOPRIL) 20 MG tablet TAKE 1 TABLET(20 MG) BY MOUTH DAILY (Patient not taking: No sig reported) 90 tablet 1   Maca Root (MACA PO) Take 525 mg by mouth daily. (Patient not taking: Reported on 02/17/2021)     Milk Thistle 300 MG CAPS Take 1 capsule by  mouth daily. (Patient not taking: No sig reported)      Musculoskeletal: Strength & Muscle Tone: within normal limits Gait & Station: normal Patient leans: N/A            Psychiatric Specialty Exam:  Presentation  General Appearance:  No data recorded Eye Contact: No data recorded Speech: No data recorded Speech Volume: No data recorded Handedness: No data recorded  Mood and Affect  Mood: No data recorded Affect: No data recorded  Thought Process  Thought Processes: No data recorded Descriptions of Associations:No data recorded Orientation:No data recorded Thought Content:No data recorded History of Schizophrenia/Schizoaffective disorder:No  Duration of Psychotic Symptoms:-- (UTA)  Hallucinations:No data recorded Ideas of Reference:No data recorded Suicidal Thoughts:No data recorded Homicidal Thoughts:No data recorded  Sensorium  Memory: No data  recorded Judgment: No data recorded Insight: No data recorded  Executive Functions  Concentration: No data recorded Attention Span: No data recorded Recall: No data recorded Fund of Knowledge: No data recorded Language: No data recorded  Psychomotor Activity  Psychomotor Activity: No data recorded  Assets  Assets: No data recorded  Sleep  Sleep: No data recorded  Physical Exam: Physical Exam Vitals and nursing note reviewed.  Constitutional:      Appearance: Normal appearance.  HENT:     Head: Normocephalic and atraumatic.     Mouth/Throat:     Pharynx: Oropharynx is clear.  Eyes:     Pupils: Pupils are equal, round, and reactive to light.  Cardiovascular:     Rate and Rhythm: Normal rate and regular rhythm.  Pulmonary:     Effort: Pulmonary effort is normal.     Breath sounds: Normal breath sounds.  Abdominal:     General: Abdomen is flat.     Palpations: Abdomen is soft.  Musculoskeletal:        General: Normal range of motion.  Skin:    General: Skin is warm and dry.  Neurological:     General: No focal deficit present.     Mental Status: He is alert. Mental status is at baseline.  Psychiatric:        Attention and Perception: Attention normal.        Mood and Affect: Mood is depressed.        Speech: Speech is tangential.        Behavior: Behavior is cooperative.        Thought Content: Thought content includes suicidal ideation. Thought content does not include suicidal plan.        Cognition and Memory: Cognition is impaired.   Review of Systems  Constitutional: Negative.   HENT: Negative.    Eyes: Negative.   Respiratory: Negative.    Cardiovascular: Negative.   Gastrointestinal:  Positive for nausea.  Musculoskeletal: Negative.   Skin: Negative.   Neurological: Negative.   Psychiatric/Behavioral:  Positive for depression, substance abuse and suicidal ideas. Negative for hallucinations. The patient is nervous/anxious and has insomnia.    Blood pressure (!) 140/98, pulse 69, temperature 97.6 F (36.4 C), temperature source Axillary, resp. rate 17, height '5\' 8"'$  (1.727 m), weight 100.7 kg, SpO2 94 %. Body mass index is 33.76 kg/m.  Treatment Plan Summary: Plan patient is currently medically stabilizing.  Not having any sign of oncoming seizures confusion or delirium.  The program he was intending to go to has detox facilities and a psychiatrist.  Patient is not acutely intending self-harm.  Going to the substance abuse program already set up would probably be the best  option for him particularly as we have no beds in the psychiatric ward.  Spoke with his wife.  We have confirmed tentatively that the bed at the detox and rehab program is still available.  Case reviewed with emergency room physician.  Recommend that if the family can make arrangements for a ticket and a practical plan to get him to the program that he can then be released from the emergency room.  No other change to current plan for detox.  Disposition:  See above.  Recommending inpatient substance abuse treatment as already arranged  Alethia Berthold, MD 02/18/2021 2:22 PM

## 2021-02-18 NOTE — ED Notes (Signed)
ETOH discussed with patient where he explains that he consumes about 8.5 shots of liquor per day.  No CIWA orders placed at this time.  RN discussed with MD, CIWA orders placed, see MAR.

## 2021-02-18 NOTE — ED Provider Notes (Signed)
Emergency Medicine Observation Re-evaluation Note  Stuart Bruce is a 60 y.o. male, seen on rounds today.  Pt initially presented to the ED for complaints of Altered Mental Status Currently, the patient is resting comfortably.  Physical Exam  BP (!) 141/100 (BP Location: Left Arm)   Pulse 61   Temp 97.6 F (36.4 C) (Axillary)   Resp 17   Ht '5\' 8"'$  (1.727 m)   Wt 100.7 kg   SpO2 93%   BMI 33.76 kg/m  Physical Exam General: No acute distress Cardiac: Well-perfused extremities Lungs: No respiratory distress Psych: Appropriate mood and affect  ED Course / MDM  EKG:EKG Interpretation  Date/Time:  Thursday February 17 2021 17:25:12 EDT Ventricular Rate:  62 PR Interval:  168 QRS Duration: 107 QT Interval:  434 QTC Calculation: 441 R Axis:   -58 Text Interpretation: Sinus rhythm Left anterior fascicular block Abnormal R-wave progression, late transition Baseline wander in lead(s) V5 Confirmed by UNCONFIRMED, DOCTOR ([id number]), editor Mel Almond, Tammy [phone]) on 02/18/2021 8:08:50 AM  I have reviewed the labs performed to date as well as medications administered while in observation.  Recent changes in the last 24 hours include none.  Plan  Current plan is for inpatient substance abuse treatment.  Stuart Bruce is not under involuntary commitment.     Naaman Plummer, MD 02/18/21 [phone]

## 2021-02-18 NOTE — ED Notes (Signed)
Pt given breakfast tray

## 2021-02-18 NOTE — ED Notes (Signed)
Dr.Clapacs at bedside  

## 2021-02-19 DIAGNOSIS — F1020 Alcohol dependence, uncomplicated: Secondary | ICD-10-CM | POA: Diagnosis not present

## 2021-02-19 DIAGNOSIS — R188 Other ascites: Secondary | ICD-10-CM | POA: Diagnosis not present

## 2021-02-19 DIAGNOSIS — I10 Essential (primary) hypertension: Secondary | ICD-10-CM | POA: Diagnosis not present

## 2021-02-19 DIAGNOSIS — R7401 Elevation of levels of liver transaminase levels: Secondary | ICD-10-CM | POA: Diagnosis not present

## 2021-02-19 DIAGNOSIS — E669 Obesity, unspecified: Secondary | ICD-10-CM | POA: Diagnosis not present

## 2021-02-20 DIAGNOSIS — I10 Essential (primary) hypertension: Secondary | ICD-10-CM | POA: Diagnosis not present

## 2021-02-20 DIAGNOSIS — R7401 Elevation of levels of liver transaminase levels: Secondary | ICD-10-CM | POA: Diagnosis not present

## 2021-02-20 DIAGNOSIS — R188 Other ascites: Secondary | ICD-10-CM | POA: Diagnosis not present

## 2021-02-20 DIAGNOSIS — F1020 Alcohol dependence, uncomplicated: Secondary | ICD-10-CM | POA: Diagnosis not present

## 2021-02-21 DIAGNOSIS — I10 Essential (primary) hypertension: Secondary | ICD-10-CM | POA: Diagnosis not present

## 2021-02-21 DIAGNOSIS — R7401 Elevation of levels of liver transaminase levels: Secondary | ICD-10-CM | POA: Diagnosis not present

## 2021-02-21 DIAGNOSIS — F1020 Alcohol dependence, uncomplicated: Secondary | ICD-10-CM | POA: Diagnosis not present

## 2021-02-21 DIAGNOSIS — R188 Other ascites: Secondary | ICD-10-CM | POA: Diagnosis not present

## 2021-02-22 DIAGNOSIS — F1020 Alcohol dependence, uncomplicated: Secondary | ICD-10-CM | POA: Diagnosis not present

## 2021-02-22 DIAGNOSIS — I10 Essential (primary) hypertension: Secondary | ICD-10-CM | POA: Diagnosis not present

## 2021-02-22 DIAGNOSIS — R188 Other ascites: Secondary | ICD-10-CM | POA: Diagnosis not present

## 2021-02-22 DIAGNOSIS — R7401 Elevation of levels of liver transaminase levels: Secondary | ICD-10-CM | POA: Diagnosis not present

## 2021-02-23 DIAGNOSIS — F1020 Alcohol dependence, uncomplicated: Secondary | ICD-10-CM | POA: Diagnosis not present

## 2021-02-23 DIAGNOSIS — E669 Obesity, unspecified: Secondary | ICD-10-CM | POA: Diagnosis not present

## 2021-02-23 DIAGNOSIS — I10 Essential (primary) hypertension: Secondary | ICD-10-CM | POA: Diagnosis not present

## 2021-02-23 DIAGNOSIS — R188 Other ascites: Secondary | ICD-10-CM | POA: Diagnosis not present

## 2021-02-23 DIAGNOSIS — R7401 Elevation of levels of liver transaminase levels: Secondary | ICD-10-CM | POA: Diagnosis not present

## 2021-02-24 DIAGNOSIS — I10 Essential (primary) hypertension: Secondary | ICD-10-CM | POA: Diagnosis not present

## 2021-02-24 DIAGNOSIS — R188 Other ascites: Secondary | ICD-10-CM | POA: Diagnosis not present

## 2021-02-24 DIAGNOSIS — R7401 Elevation of levels of liver transaminase levels: Secondary | ICD-10-CM | POA: Diagnosis not present

## 2021-02-24 DIAGNOSIS — F1020 Alcohol dependence, uncomplicated: Secondary | ICD-10-CM | POA: Diagnosis not present

## 2021-02-25 DIAGNOSIS — R7401 Elevation of levels of liver transaminase levels: Secondary | ICD-10-CM | POA: Diagnosis not present

## 2021-02-25 DIAGNOSIS — F1020 Alcohol dependence, uncomplicated: Secondary | ICD-10-CM | POA: Diagnosis not present

## 2021-02-25 DIAGNOSIS — I10 Essential (primary) hypertension: Secondary | ICD-10-CM | POA: Diagnosis not present

## 2021-02-25 DIAGNOSIS — R188 Other ascites: Secondary | ICD-10-CM | POA: Diagnosis not present

## 2021-02-26 DIAGNOSIS — R7401 Elevation of levels of liver transaminase levels: Secondary | ICD-10-CM | POA: Diagnosis not present

## 2021-02-26 DIAGNOSIS — I10 Essential (primary) hypertension: Secondary | ICD-10-CM | POA: Diagnosis not present

## 2021-02-26 DIAGNOSIS — R188 Other ascites: Secondary | ICD-10-CM | POA: Diagnosis not present

## 2021-02-26 DIAGNOSIS — F1020 Alcohol dependence, uncomplicated: Secondary | ICD-10-CM | POA: Diagnosis not present

## 2021-02-27 DIAGNOSIS — I10 Essential (primary) hypertension: Secondary | ICD-10-CM | POA: Diagnosis not present

## 2021-02-27 DIAGNOSIS — R188 Other ascites: Secondary | ICD-10-CM | POA: Diagnosis not present

## 2021-02-27 DIAGNOSIS — R7401 Elevation of levels of liver transaminase levels: Secondary | ICD-10-CM | POA: Diagnosis not present

## 2021-02-27 DIAGNOSIS — F1020 Alcohol dependence, uncomplicated: Secondary | ICD-10-CM | POA: Diagnosis not present

## 2021-02-28 DIAGNOSIS — R7401 Elevation of levels of liver transaminase levels: Secondary | ICD-10-CM | POA: Diagnosis not present

## 2021-02-28 DIAGNOSIS — F1020 Alcohol dependence, uncomplicated: Secondary | ICD-10-CM | POA: Diagnosis not present

## 2021-02-28 DIAGNOSIS — I10 Essential (primary) hypertension: Secondary | ICD-10-CM | POA: Diagnosis not present

## 2021-02-28 DIAGNOSIS — R188 Other ascites: Secondary | ICD-10-CM | POA: Diagnosis not present

## 2021-03-02 DIAGNOSIS — F1020 Alcohol dependence, uncomplicated: Secondary | ICD-10-CM | POA: Diagnosis not present

## 2021-03-15 DIAGNOSIS — F1020 Alcohol dependence, uncomplicated: Secondary | ICD-10-CM | POA: Diagnosis not present

## 2021-03-15 DIAGNOSIS — Z79899 Other long term (current) drug therapy: Secondary | ICD-10-CM | POA: Diagnosis not present

## 2021-03-15 DIAGNOSIS — F330 Major depressive disorder, recurrent, mild: Secondary | ICD-10-CM | POA: Diagnosis not present

## 2021-03-16 DIAGNOSIS — F1020 Alcohol dependence, uncomplicated: Secondary | ICD-10-CM | POA: Diagnosis not present

## 2021-03-17 DIAGNOSIS — F1020 Alcohol dependence, uncomplicated: Secondary | ICD-10-CM | POA: Diagnosis not present

## 2021-03-18 DIAGNOSIS — F1020 Alcohol dependence, uncomplicated: Secondary | ICD-10-CM | POA: Diagnosis not present

## 2021-03-19 DIAGNOSIS — F1020 Alcohol dependence, uncomplicated: Secondary | ICD-10-CM | POA: Diagnosis not present

## 2021-03-20 DIAGNOSIS — F1020 Alcohol dependence, uncomplicated: Secondary | ICD-10-CM | POA: Diagnosis not present

## 2021-03-21 DIAGNOSIS — F1020 Alcohol dependence, uncomplicated: Secondary | ICD-10-CM | POA: Diagnosis not present

## 2021-03-22 DIAGNOSIS — F1020 Alcohol dependence, uncomplicated: Secondary | ICD-10-CM | POA: Diagnosis not present

## 2021-03-23 DIAGNOSIS — F330 Major depressive disorder, recurrent, mild: Secondary | ICD-10-CM | POA: Diagnosis not present

## 2021-03-23 DIAGNOSIS — Z79899 Other long term (current) drug therapy: Secondary | ICD-10-CM | POA: Diagnosis not present

## 2021-03-23 DIAGNOSIS — F1020 Alcohol dependence, uncomplicated: Secondary | ICD-10-CM | POA: Diagnosis not present

## 2021-03-24 DIAGNOSIS — F1020 Alcohol dependence, uncomplicated: Secondary | ICD-10-CM | POA: Diagnosis not present

## 2021-03-25 DIAGNOSIS — F1020 Alcohol dependence, uncomplicated: Secondary | ICD-10-CM | POA: Diagnosis not present

## 2021-03-26 DIAGNOSIS — F1020 Alcohol dependence, uncomplicated: Secondary | ICD-10-CM | POA: Diagnosis not present

## 2021-03-27 DIAGNOSIS — F1020 Alcohol dependence, uncomplicated: Secondary | ICD-10-CM | POA: Diagnosis not present

## 2021-03-28 DIAGNOSIS — F1020 Alcohol dependence, uncomplicated: Secondary | ICD-10-CM | POA: Diagnosis not present

## 2021-03-29 DIAGNOSIS — F1020 Alcohol dependence, uncomplicated: Secondary | ICD-10-CM | POA: Diagnosis not present

## 2021-03-30 DIAGNOSIS — F1020 Alcohol dependence, uncomplicated: Secondary | ICD-10-CM | POA: Diagnosis not present

## 2021-03-31 DIAGNOSIS — F1020 Alcohol dependence, uncomplicated: Secondary | ICD-10-CM | POA: Diagnosis not present

## 2021-04-01 DIAGNOSIS — F1020 Alcohol dependence, uncomplicated: Secondary | ICD-10-CM | POA: Diagnosis not present

## 2021-04-02 DIAGNOSIS — F1020 Alcohol dependence, uncomplicated: Secondary | ICD-10-CM | POA: Diagnosis not present

## 2021-04-03 DIAGNOSIS — F1020 Alcohol dependence, uncomplicated: Secondary | ICD-10-CM | POA: Diagnosis not present

## 2021-04-04 DIAGNOSIS — F1020 Alcohol dependence, uncomplicated: Secondary | ICD-10-CM | POA: Diagnosis not present

## 2021-04-05 DIAGNOSIS — F1020 Alcohol dependence, uncomplicated: Secondary | ICD-10-CM | POA: Diagnosis not present

## 2021-04-06 DIAGNOSIS — F1020 Alcohol dependence, uncomplicated: Secondary | ICD-10-CM | POA: Diagnosis not present

## 2021-04-07 DIAGNOSIS — F1020 Alcohol dependence, uncomplicated: Secondary | ICD-10-CM | POA: Diagnosis not present

## 2021-04-08 DIAGNOSIS — F1020 Alcohol dependence, uncomplicated: Secondary | ICD-10-CM | POA: Diagnosis not present

## 2021-04-09 DIAGNOSIS — F1020 Alcohol dependence, uncomplicated: Secondary | ICD-10-CM | POA: Diagnosis not present

## 2021-04-10 DIAGNOSIS — F1020 Alcohol dependence, uncomplicated: Secondary | ICD-10-CM | POA: Diagnosis not present

## 2021-04-11 DIAGNOSIS — F1020 Alcohol dependence, uncomplicated: Secondary | ICD-10-CM | POA: Diagnosis not present

## 2021-04-12 DIAGNOSIS — F1020 Alcohol dependence, uncomplicated: Secondary | ICD-10-CM | POA: Diagnosis not present

## 2021-04-13 DIAGNOSIS — F1020 Alcohol dependence, uncomplicated: Secondary | ICD-10-CM | POA: Diagnosis not present

## 2021-04-14 DIAGNOSIS — F1020 Alcohol dependence, uncomplicated: Secondary | ICD-10-CM | POA: Diagnosis not present

## 2021-04-18 ENCOUNTER — Other Ambulatory Visit: Payer: Self-pay

## 2021-04-18 DIAGNOSIS — F1020 Alcohol dependence, uncomplicated: Secondary | ICD-10-CM | POA: Diagnosis not present

## 2021-04-18 MED ORDER — NALTREXONE HCL 50 MG PO TABS
ORAL_TABLET | ORAL | 0 refills | Status: DC
  Filled 2021-04-18: qty 30, 30d supply, fill #0

## 2021-04-18 MED ORDER — LISINOPRIL-HYDROCHLOROTHIAZIDE 20-12.5 MG PO TABS
ORAL_TABLET | ORAL | 0 refills | Status: DC
  Filled 2021-04-18: qty 30, 30d supply, fill #0

## 2021-04-18 MED ORDER — ESCITALOPRAM OXALATE 10 MG PO TABS
ORAL_TABLET | ORAL | 0 refills | Status: DC
  Filled 2021-04-18: qty 30, 30d supply, fill #0

## 2021-04-20 DIAGNOSIS — F1020 Alcohol dependence, uncomplicated: Secondary | ICD-10-CM | POA: Diagnosis not present

## 2021-04-21 DIAGNOSIS — F1020 Alcohol dependence, uncomplicated: Secondary | ICD-10-CM | POA: Diagnosis not present

## 2021-04-22 ENCOUNTER — Other Ambulatory Visit: Payer: Self-pay

## 2021-04-25 ENCOUNTER — Other Ambulatory Visit: Payer: Self-pay

## 2021-04-25 DIAGNOSIS — F1020 Alcohol dependence, uncomplicated: Secondary | ICD-10-CM | POA: Diagnosis not present

## 2021-04-26 ENCOUNTER — Encounter: Payer: Self-pay | Admitting: Family Medicine

## 2021-04-26 ENCOUNTER — Other Ambulatory Visit: Payer: Self-pay

## 2021-04-26 ENCOUNTER — Ambulatory Visit (INDEPENDENT_AMBULATORY_CARE_PROVIDER_SITE_OTHER): Payer: 59 | Admitting: Family Medicine

## 2021-04-26 VITALS — BP 124/82 | HR 67 | Temp 96.9°F | Ht 68.0 in | Wt 209.6 lb

## 2021-04-26 DIAGNOSIS — Z23 Encounter for immunization: Secondary | ICD-10-CM

## 2021-04-26 DIAGNOSIS — I10 Essential (primary) hypertension: Secondary | ICD-10-CM

## 2021-04-26 DIAGNOSIS — Z125 Encounter for screening for malignant neoplasm of prostate: Secondary | ICD-10-CM | POA: Diagnosis not present

## 2021-04-26 DIAGNOSIS — F1020 Alcohol dependence, uncomplicated: Secondary | ICD-10-CM | POA: Diagnosis not present

## 2021-04-26 DIAGNOSIS — Z13220 Encounter for screening for lipoid disorders: Secondary | ICD-10-CM

## 2021-04-26 DIAGNOSIS — R7303 Prediabetes: Secondary | ICD-10-CM | POA: Diagnosis not present

## 2021-04-26 DIAGNOSIS — F1010 Alcohol abuse, uncomplicated: Secondary | ICD-10-CM | POA: Diagnosis not present

## 2021-04-26 LAB — LIPID PANEL
Cholesterol: 257 mg/dL — ABNORMAL HIGH (ref 0–200)
HDL: 54.1 mg/dL (ref >39.00)
LDL Cholesterol: 166 mg/dL — ABNORMAL HIGH (ref 0–99)
NonHDL: 203.05
Total CHOL/HDL Ratio: 5
Triglycerides: 187 mg/dL — ABNORMAL HIGH (ref 0.0–149.0)
VLDL: 37.4 mg/dL (ref 0.0–40.0)

## 2021-04-26 LAB — COMPREHENSIVE METABOLIC PANEL
ALT: 34 U/L (ref 0–53)
AST: 33 U/L (ref 0–37)
Albumin: 4.5 g/dL (ref 3.5–5.2)
Alkaline Phosphatase: 44 U/L (ref 39–117)
BUN: 9 mg/dL (ref 6–23)
CO2: 31 mEq/L (ref 19–32)
Calcium: 9.3 mg/dL (ref 8.4–10.5)
Chloride: 101 mEq/L (ref 96–112)
Creatinine, Ser: 0.87 mg/dL (ref 0.40–1.50)
GFR: 95.44 mL/min (ref >60.00)
Glucose, Bld: 106 mg/dL — ABNORMAL HIGH (ref 70–99)
Potassium: 4.4 mEq/L (ref 3.5–5.1)
Sodium: 141 mEq/L (ref 135–145)
Total Bilirubin: 0.5 mg/dL (ref 0.2–1.2)
Total Protein: 7.1 g/dL (ref 6.0–8.3)

## 2021-04-26 LAB — HEMOGLOBIN A1C: Hgb A1c MFr Bld: 6.2 % (ref 4.6–6.5)

## 2021-04-26 LAB — PSA: PSA: 1.94 ng/mL (ref 0.10–4.00)

## 2021-04-26 NOTE — Patient Instructions (Signed)
Nice to see you. We will check labs today.  

## 2021-04-26 NOTE — Progress Notes (Signed)
[name], MD Phone: [phone]  Stuart Bruce is a 60 y.o. male who presents today for follow-up.  Alcohol abuse: Patient has undergone substance abuse treatment.  He did inpatient detox for this in [place] and then was transferred to [facility] to complete his 30-day treatment.  He is still doing intensive outpatient treatment virtually with the program in [place].  He is enjoying it.  He is on naltrexone.  He denies any alcohol use.  He is going to AA daily though is still looking for a sponsor.  His wife notes he has days since he has been home where he seems a little off though the patient denies any alcohol use during this timeframe.  Hypertension: Not checking blood pressure at home.  He is taking lisinopril/HCTZ.  No chest pain, shortness of breath, or edema.  Social History   Tobacco Use  Smoking Status Never  Smokeless Tobacco Never    Current Outpatient Medications on File Prior to Visit  Medication Sig Dispense Refill   Ascorbic Acid (VITAMIN C) 1000 MG tablet Take 1,000 mg by mouth daily.     ASPIRIN LOW DOSE 81 MG chewable tablet Chew 81 mg by mouth daily.     B Complex Vitamins (B COMPLEX 100 PO) Take 1 capsule by mouth daily.     escitalopram (LEXAPRO) 10 MG tablet TAKE 1 TABLET(10 MG) BY MOUTH DAILY 90 tablet 1   lisinopril-hydrochlorothiazide (ZESTORETIC) 20-12.5 MG tablet Take 1 tablet by mouth daily 30 tablet 0   naltrexone (DEPADE) 50 MG tablet Take 1 tablet by mouth daily 30 tablet 0   Coenzyme Q10 (CO Q-10) 200 MG CAPS Take 1 capsule by mouth daily. (Patient not taking: Reported on 04/26/2021)     doxazosin (CARDURA) 8 MG tablet TAKE 1 TABLET(8 MG) BY MOUTH DAILY (Patient not taking: Reported on 04/26/2021) 90 tablet 3   escitalopram (LEXAPRO) 10 MG tablet Take 1 tablet by mouth daily (Patient not taking: Reported on 04/26/2021) 30 tablet 0   fosinopril (MONOPRIL) 20 MG tablet TAKE 1 TABLET(20 MG) BY MOUTH DAILY (Patient not taking: Reported on 04/26/2021)  90 tablet 1   lisinopril-hydrochlorothiazide (ZESTORETIC) 20-12.5 MG tablet Take 1 tablet by mouth daily. (Patient not taking: Reported on 04/26/2021)     Maca Root (MACA PO) Take 525 mg by mouth daily. (Patient not taking: Reported on 04/26/2021)     Milk Thistle 300 MG CAPS Take 1 capsule by mouth daily. (Patient not taking: Reported on 04/26/2021)     naltrexone (DEPADE) 50 MG tablet Take 50 mg by mouth daily. (Patient not taking: Reported on 04/26/2021)     Current Facility-Administered Medications on File Prior to Visit  Medication Dose Route Frequency Provider Last Rate Last Admin   0.9 %  sodium chloride infusion  500 mL Intravenous Continuous Pyrtle, Lajuan Lines, MD         ROS see history of present illness  Objective  Physical Exam Vitals:   04/26/21 1205  BP: 124/82  Pulse: 67  Temp: (!) 96.9 F (36.1 C)  SpO2: 98%    BP Readings from Last 3 Encounters:  04/26/21 124/82  02/18/21 (!) 155/89  12/25/20 132/88   Wt Readings from Last 3 Encounters:  04/26/21 209 lb 9.6 oz (95.1 kg)  02/17/21 222 lb 0.1 oz (100.7 kg)  12/24/20 190 lb (86.2 kg)    Physical Exam Constitutional:      General: He is not in acute distress.    Appearance: He is not diaphoretic.  Cardiovascular:     Rate and Rhythm: Normal rate and regular rhythm.     Heart sounds: Normal heart sounds.  Pulmonary:     Effort: Pulmonary effort is normal.     Breath sounds: Normal breath sounds.  Skin:    General: Skin is warm and dry.  Neurological:     Mental Status: He is alert.     Assessment/Plan: Please see individual problem list.  Problem List Items Addressed This Visit     Alcohol abuse    The patient is in recovery.  I encouraged him to stick with his intensive outpatient treatment as well as AA.  He will continue to look for a sponsor.  He will continue to work through this process.      Benign essential HTN    Adequate.  We will continue lisinopril/HCTZ 1 tablet daily.  We will check  lab work.      Other Visit Diagnoses     Needs flu shot    -  Primary   Relevant Orders   Flu Vaccine QUAD 6+ mos PF IM (Fluarix Quad PF) (Completed)   Prediabetes       Relevant Orders   HgB A1c   Lipid screening       Relevant Orders   Comp Met (CMET)   Lipid panel   Prostate cancer screening       Relevant Orders   PSA       Return in about 3 months (around 07/27/2021) for recovery follow-up.  This visit occurred during the SARS-CoV-2 public health emergency.  Safety protocols were in place, including screening questions prior to the visit, additional usage of staff PPE, and extensive cleaning of exam room while observing appropriate contact time as indicated for disinfecting solutions.    [name], MD [place]

## 2021-04-26 NOTE — Assessment & Plan Note (Signed)
Adequate.  We will continue lisinopril/HCTZ 1 tablet daily.  We will check lab work.

## 2021-04-26 NOTE — Assessment & Plan Note (Signed)
The patient is in recovery.  I encouraged him to stick with his intensive outpatient treatment as well as AA.  He will continue to look for a sponsor.  He will continue to work through this process.

## 2021-04-27 DIAGNOSIS — F1020 Alcohol dependence, uncomplicated: Secondary | ICD-10-CM | POA: Diagnosis not present

## 2021-05-02 ENCOUNTER — Telehealth: Payer: Self-pay | Admitting: Family Medicine

## 2021-05-02 DIAGNOSIS — F1020 Alcohol dependence, uncomplicated: Secondary | ICD-10-CM | POA: Diagnosis not present

## 2021-05-02 NOTE — Telephone Encounter (Signed)
For your information  

## 2021-05-02 NOTE — Telephone Encounter (Signed)
Patient returned the call he received lab results and acknowledge he understood. He is willing to do diet an exercise for his A1C and cholesterol . If his numbers do not change at the next visit he will consider taking medication.

## 2021-05-02 NOTE — Telephone Encounter (Signed)
-----   Message from Leone Haven, MD sent at 04/27/2021  4:57 PM EST ----- Please let the patient know that his A1c is in the prediabetic range.  He needs to work on diet and exercise for this.  His cholesterol is elevated.  He would benefit from a statin to reduce his risk of stroke and heart attack.  I can send this to his pharmacy if he is willing.  His other lab work is acceptable.  The 10-year ASCVD risk score (Arnett DK, et al., 2019) is: 9.7%   Values used to calculate the score:     Age: 60 years     Sex: Male     Is Non-Hispanic African American: No     Diabetic: No     Tobacco smoker: No     Systolic Blood Pressure: 277 mmHg     Is BP treated: Yes     HDL Cholesterol: 54.1 mg/dL     Total Cholesterol: 257 mg/dL

## 2021-05-02 NOTE — Telephone Encounter (Signed)
Pt called in stating that he had receive a message over the weekend about result for labs. Pt called in stating he is returning phone call. Pt is requesting callback

## 2021-05-03 NOTE — Telephone Encounter (Signed)
Noted  

## 2021-05-04 DIAGNOSIS — F1020 Alcohol dependence, uncomplicated: Secondary | ICD-10-CM | POA: Diagnosis not present

## 2021-05-05 DIAGNOSIS — F1020 Alcohol dependence, uncomplicated: Secondary | ICD-10-CM | POA: Diagnosis not present

## 2021-05-06 ENCOUNTER — Other Ambulatory Visit: Payer: Self-pay

## 2021-05-09 DIAGNOSIS — F1020 Alcohol dependence, uncomplicated: Secondary | ICD-10-CM | POA: Diagnosis not present

## 2021-05-11 DIAGNOSIS — F1020 Alcohol dependence, uncomplicated: Secondary | ICD-10-CM | POA: Diagnosis not present

## 2021-05-12 DIAGNOSIS — F1020 Alcohol dependence, uncomplicated: Secondary | ICD-10-CM | POA: Diagnosis not present

## 2021-05-16 DIAGNOSIS — F1020 Alcohol dependence, uncomplicated: Secondary | ICD-10-CM | POA: Diagnosis not present

## 2021-05-17 DIAGNOSIS — F1020 Alcohol dependence, uncomplicated: Secondary | ICD-10-CM | POA: Diagnosis not present

## 2021-05-19 ENCOUNTER — Telehealth: Payer: Self-pay | Admitting: Family Medicine

## 2021-05-19 NOTE — Telephone Encounter (Signed)
Pt called in regards to medication he used to have prescribed to him by Dr. Lorre Nick. Pt was previously in recovery at [facility]. Pt would like to continue this medication however he is no longer a pt of [facility]. Pt uses the [facility]

## 2021-05-20 NOTE — Telephone Encounter (Signed)
LVM for patient to call back.   Stuart Bruce,cma  

## 2021-05-23 DIAGNOSIS — F1020 Alcohol dependence, uncomplicated: Secondary | ICD-10-CM | POA: Diagnosis not present

## 2021-05-24 NOTE — Telephone Encounter (Signed)
LVM for patient to call back.   Megan Hayduk,cma  

## 2021-05-24 NOTE — Telephone Encounter (Signed)
This is not a medication that I typically manage. He will need to establish with a specialist that manages this type of medication. Though I can provide a refill to cover until he is able to get in to see the specialist for this. Does he need me to place a referral for this? Please confirm what dose he is taking of the naltrexone.

## 2021-05-25 DIAGNOSIS — F1020 Alcohol dependence, uncomplicated: Secondary | ICD-10-CM | POA: Diagnosis not present

## 2021-05-26 DIAGNOSIS — F1020 Alcohol dependence, uncomplicated: Secondary | ICD-10-CM | POA: Diagnosis not present

## 2021-05-28 DIAGNOSIS — F1020 Alcohol dependence, uncomplicated: Secondary | ICD-10-CM | POA: Diagnosis not present

## 2021-05-31 DIAGNOSIS — F1020 Alcohol dependence, uncomplicated: Secondary | ICD-10-CM | POA: Diagnosis not present

## 2021-06-02 DIAGNOSIS — F1020 Alcohol dependence, uncomplicated: Secondary | ICD-10-CM | POA: Diagnosis not present

## 2021-06-03 ENCOUNTER — Other Ambulatory Visit: Payer: Self-pay | Admitting: Family

## 2021-06-03 DIAGNOSIS — F1010 Alcohol abuse, uncomplicated: Secondary | ICD-10-CM

## 2021-06-03 NOTE — Telephone Encounter (Signed)
LVM for patient to call back, a letter was sent out today. Stuart Bruce,cma

## 2021-06-03 NOTE — Telephone Encounter (Signed)
I called and informed the patient that a referral is not needed he can call and schedule an appointment and he understood.  Ezabella Teska,cma

## 2021-06-03 NOTE — Telephone Encounter (Signed)
Patient finally returned my call and I informed him that the medication he is requesting is provided by a specialist and he would need a referral.  Patient stated he would like the referral for [facility] that is the specialist he had before.  Amos Micheals,cma

## 2021-06-03 NOTE — Progress Notes (Signed)
After 3 attempts to reach the patient a letter was mailed out today for the patient to call the office about the medication requested.  Izumi Mixon,cma

## 2021-06-07 DIAGNOSIS — F1020 Alcohol dependence, uncomplicated: Secondary | ICD-10-CM | POA: Diagnosis not present

## 2021-06-08 DIAGNOSIS — F1020 Alcohol dependence, uncomplicated: Secondary | ICD-10-CM | POA: Diagnosis not present

## 2021-06-13 DIAGNOSIS — F1020 Alcohol dependence, uncomplicated: Secondary | ICD-10-CM | POA: Diagnosis not present

## 2021-06-15 DIAGNOSIS — F1020 Alcohol dependence, uncomplicated: Secondary | ICD-10-CM | POA: Diagnosis not present

## 2021-06-16 DIAGNOSIS — F1020 Alcohol dependence, uncomplicated: Secondary | ICD-10-CM | POA: Diagnosis not present

## 2021-06-24 DIAGNOSIS — F1020 Alcohol dependence, uncomplicated: Secondary | ICD-10-CM | POA: Diagnosis not present

## 2021-07-01 DIAGNOSIS — F1020 Alcohol dependence, uncomplicated: Secondary | ICD-10-CM | POA: Diagnosis not present

## 2021-07-08 DIAGNOSIS — F1020 Alcohol dependence, uncomplicated: Secondary | ICD-10-CM | POA: Diagnosis not present

## 2021-07-29 DIAGNOSIS — F1020 Alcohol dependence, uncomplicated: Secondary | ICD-10-CM | POA: Diagnosis not present

## 2021-08-02 DIAGNOSIS — F1020 Alcohol dependence, uncomplicated: Secondary | ICD-10-CM | POA: Diagnosis not present

## 2021-08-03 ENCOUNTER — Other Ambulatory Visit: Payer: Self-pay

## 2021-08-03 ENCOUNTER — Ambulatory Visit: Payer: Managed Care, Other (non HMO) | Admitting: Family Medicine

## 2021-08-03 DIAGNOSIS — F1020 Alcohol dependence, uncomplicated: Secondary | ICD-10-CM | POA: Diagnosis not present

## 2021-08-03 DIAGNOSIS — F330 Major depressive disorder, recurrent, mild: Secondary | ICD-10-CM | POA: Diagnosis not present

## 2021-08-03 MED ORDER — NALTREXONE HCL 50 MG PO TABS
ORAL_TABLET | ORAL | 0 refills | Status: DC
  Filled 2021-08-03: qty 30, 30d supply, fill #0

## 2021-08-08 ENCOUNTER — Other Ambulatory Visit: Payer: Self-pay

## 2021-08-12 DIAGNOSIS — F1020 Alcohol dependence, uncomplicated: Secondary | ICD-10-CM | POA: Diagnosis not present

## 2021-08-16 ENCOUNTER — Encounter: Payer: Self-pay | Admitting: Family Medicine

## 2021-08-16 ENCOUNTER — Other Ambulatory Visit: Payer: Self-pay

## 2021-08-16 ENCOUNTER — Ambulatory Visit: Payer: 59 | Admitting: Family Medicine

## 2021-08-16 VITALS — BP 138/79 | HR 66 | Temp 98.4°F | Ht 68.0 in | Wt 205.2 lb

## 2021-08-16 DIAGNOSIS — R0609 Other forms of dyspnea: Secondary | ICD-10-CM | POA: Diagnosis not present

## 2021-08-16 DIAGNOSIS — I10 Essential (primary) hypertension: Secondary | ICD-10-CM | POA: Diagnosis not present

## 2021-08-16 DIAGNOSIS — F1010 Alcohol abuse, uncomplicated: Secondary | ICD-10-CM

## 2021-08-16 LAB — COMPREHENSIVE METABOLIC PANEL
ALT: 20 U/L (ref 0–53)
AST: 31 U/L (ref 0–37)
Albumin: 4.1 g/dL (ref 3.5–5.2)
Alkaline Phosphatase: 54 U/L (ref 39–117)
BUN: 5 mg/dL — ABNORMAL LOW (ref 6–23)
CO2: 30 mEq/L (ref 19–32)
Calcium: 9.5 mg/dL (ref 8.4–10.5)
Chloride: 97 mEq/L (ref 96–112)
Creatinine, Ser: 0.89 mg/dL (ref 0.40–1.50)
GFR: 94.58 mL/min (ref >60.00)
Glucose, Bld: 115 mg/dL — ABNORMAL HIGH (ref 70–99)
Potassium: 3.7 mEq/L (ref 3.5–5.1)
Sodium: 135 mEq/L (ref 135–145)
Total Bilirubin: 0.8 mg/dL (ref 0.2–1.2)
Total Protein: 7 g/dL (ref 6.0–8.3)

## 2021-08-16 LAB — CBC
HCT: 45 % (ref 39.0–52.0)
Hemoglobin: 14.8 g/dL (ref 13.0–17.0)
MCHC: 32.9 g/dL (ref 30.0–36.0)
MCV: 95.6 fl (ref 78.0–100.0)
Platelets: 236 10*3/uL (ref 150.0–400.0)
RBC: 4.7 Mil/uL (ref 4.22–5.81)
RDW: 14.7 % (ref 11.5–15.5)
WBC: 6.4 10*3/uL (ref 4.0–10.5)

## 2021-08-16 LAB — TSH: TSH: 0.56 u[IU]/mL (ref 0.35–5.50)

## 2021-08-16 MED ORDER — LISINOPRIL-HYDROCHLOROTHIAZIDE 20-25 MG PO TABS
1.0000 | ORAL_TABLET | Freq: Every day | ORAL | 3 refills | Status: DC
Start: 2021-08-16 — End: 2021-11-21
  Filled 2021-08-16: qty 90, 90d supply, fill #0

## 2021-08-16 NOTE — Assessment & Plan Note (Signed)
The patient is in recovery.  He will continue with his outpatient treatment.  ?

## 2021-08-16 NOTE — Assessment & Plan Note (Addendum)
Above goal in the office and more elevated at home.  Discussed increasing his hydrochlorothiazide to 25 mg once daily.  He will continue lisinopril 20 mg once daily.  We will check lab work today and in 1 month.  He will have a nurse BP check in 1 month.  He will monitor for lightheadedness with the increased dose of his hydrochlorothiazide. ?

## 2021-08-16 NOTE — Assessment & Plan Note (Signed)
Discussed possible causes including cardiac, pulmonary, and deconditioning as well as anemia.  We will get lab work today.  EKG completed and this was reassuring.  Discussed the potential for seeing cardiology if his lab work does not reveal a cause.  Suspect either a cardiac cause or deconditioning.  His exam is reassuring regarding a pulmonary cause. ?

## 2021-08-16 NOTE — Patient Instructions (Signed)
Nice to see you. ?We will increase your hydrochlorothiazide portion of your blood pressure medicine.  We will check lab work in 1 month and have a nurse blood pressure check at that time. ?We will contact you with your lab results today. ?

## 2021-08-16 NOTE — Progress Notes (Signed)
?[name], MD ?Phone: [phone] ? ?Stuart Bruce is a 61 y.o. male who presents today for f/u. ? ?HYPERTENSION ?Disease Monitoring ?Home BP Monitoring 140s-150s/90s-100s Chest pain- no    Dyspnea- yes ?Medications ?Compliance-  taking lisinopril-HCTZ.   Edema- no ?BMET ?   ?Component Value Date/Time  ? NA 141 04/26/2021 1232  ? K 4.4 04/26/2021 1232  ? CL 101 04/26/2021 1232  ? CO2 31 04/26/2021 1232  ? GLUCOSE 106 (H) 04/26/2021 1232  ? BUN 9 04/26/2021 1232  ? CREATININE 0.87 04/26/2021 1232  ? CALCIUM 9.3 04/26/2021 1232  ? GFRNONAA >60 02/17/2021 1729  ? GFRAA >60 07/09/2019 1852  ? ?Dyspnea on exertion: Patient notes this has been going on for couple of months now.  He notes some sweatiness with it.  It takes about a minute to recover once he starts to rest.  No chest pain.  No cough.  No wheezing.  No history of MI.  No family history of MI. ? ?Alcohol abuse: Patient notes he restarted naltrexone recently.  He has been seeing a Optometrist for this in [place].  This has been helpful.  He has had a couple of relapses in high stress situations. ? ? ?Social History  ? ?Tobacco Use  ?Smoking Status Never  ?Smokeless Tobacco Never  ? ? ?Current Outpatient Medications on File Prior to Visit  ?Medication Sig Dispense Refill  ? Ascorbic Acid (VITAMIN C) 1000 MG tablet Take 1,000 mg by mouth daily.    ? ASPIRIN LOW DOSE 81 MG chewable tablet Chew 81 mg by mouth daily.    ? B Complex Vitamins (B COMPLEX 100 PO) Take 1 capsule by mouth daily.    ? escitalopram (LEXAPRO) 10 MG tablet TAKE 1 TABLET(10 MG) BY MOUTH DAILY 90 tablet 1  ? naltrexone (DEPADE) 50 MG tablet Take 1 tablet by mouth daily 30 tablet 0  ? naltrexone (DEPADE) 50 MG tablet Take 1 oral tablet once a day 30 tablet 0  ? ?Current Facility-Administered Medications on File Prior to Visit  ?Medication Dose Route Frequency Provider Last Rate Last Admin  ? 0.9 %  sodium chloride infusion  500 mL Intravenous Continuous Pyrtle, Lajuan Lines, MD      ? ? ? ?ROS  see history of present illness ? ?Objective ? ?Physical Exam ?Vitals:  ? 08/16/21 1306  ?BP: 138/79  ?Pulse: 66  ?Temp: 98.4 ?F (36.9 ?C)  ?SpO2: 97%  ? ? ?BP Readings from Last 3 Encounters:  ?08/16/21 138/79  ?04/26/21 124/82  ?02/18/21 (!) 155/89  ? ?Wt Readings from Last 3 Encounters:  ?08/16/21 205 lb 3.2 oz (93.1 kg)  ?04/26/21 209 lb 9.6 oz (95.1 kg)  ?02/17/21 222 lb 0.1 oz (100.7 kg)  ? ? ?Physical Exam ?Constitutional:   ?   General: He is not in acute distress. ?   Appearance: He is not diaphoretic.  ?Cardiovascular:  ?   Rate and Rhythm: Normal rate and regular rhythm.  ?   Heart sounds: Normal heart sounds.  ?Pulmonary:  ?   Effort: Pulmonary effort is normal.  ?   Breath sounds: Normal breath sounds.  ?Musculoskeletal:  ?   Right lower leg: No edema.  ?   Left lower leg: No edema.  ?Skin: ?   General: Skin is warm and dry.  ?Neurological:  ?   Mental Status: He is alert.  ? ?EKG: Sinus rhythm, rate 64, left atrial enlargement, no ST or T wave changes compared to prior ? ?Assessment/Plan:  Please see individual problem list. ? ?Problem List Items Addressed This Visit   ? ? Alcohol abuse  ?  The patient is in recovery.  He will continue with his outpatient treatment.  ?  ?  ? Benign essential HTN  ?  Above goal in the office and more elevated at home.  Discussed increasing his hydrochlorothiazide to 25 mg once daily.  He will continue lisinopril 20 mg once daily.  We will check lab work today and in 1 month.  He will have a nurse BP check in 1 month.  He will monitor for lightheadedness with the increased dose of his hydrochlorothiazide. ?  ?  ? Relevant Medications  ? lisinopril-hydrochlorothiazide (ZESTORETIC) 20-25 MG tablet  ? Other Relevant Orders  ? Basic Metabolic Panel (BMET)  ? Dyspnea on exertion  ?  Discussed possible causes including cardiac, pulmonary, and deconditioning as well as anemia.  We will get lab work today.  EKG completed and this was reassuring.  Discussed the potential for seeing  cardiology if his lab work does not reveal a cause.  Suspect either a cardiac cause or deconditioning.  His exam is reassuring regarding a pulmonary cause. ?  ?  ? ?Other Visit Diagnoses   ? ? DOE (dyspnea on exertion)    -  Primary  ? Relevant Orders  ? EKG 12-Lead (Completed)  ? CBC  ? TSH  ? Comp Met (CMET)  ? ?  ? ? ?Return in about 4 weeks (around 09/13/2021) for Labs and nurse blood pressure check, 3 months PCP for blood pressure. ? ?This visit occurred during the SARS-CoV-2 public health emergency.  Safety protocols were in place, including screening questions prior to the visit, additional usage of staff PPE, and extensive cleaning of exam room while observing appropriate contact time as indicated for disinfecting solutions.  ? ? ?[name], MD ?[place] ? ?

## 2021-08-24 ENCOUNTER — Telehealth: Payer: Self-pay

## 2021-08-24 NOTE — Telephone Encounter (Signed)
Lvm for pt to return call in regards to labs.  ? ?Per [id number] ? ?Please of the patient know that there is no cause for his shortness of breath on exertion on his lab work.  I would like to refer him to cardiology for further evaluation.  I can place this referral if he is willing to see them.  Thanks.  ?

## 2021-08-24 NOTE — Telephone Encounter (Signed)
Pt called back... Advise Pt on note below... Advise Pt that referral coordinator will call him... Pt understood...  ?

## 2021-08-29 NOTE — Progress Notes (Signed)
No cause noted for his dyspnea on his lab work.  We will refer to cardiology for further evaluation. ?

## 2021-08-29 NOTE — Addendum Note (Signed)
Addended by: Caryl Bis Ailine Hefferan G on: 08/29/2021 12:14 PM ? ? Modules accepted: Orders ? ?

## 2021-08-31 ENCOUNTER — Other Ambulatory Visit: Payer: Self-pay

## 2021-08-31 DIAGNOSIS — F1020 Alcohol dependence, uncomplicated: Secondary | ICD-10-CM | POA: Diagnosis not present

## 2021-08-31 DIAGNOSIS — F330 Major depressive disorder, recurrent, mild: Secondary | ICD-10-CM | POA: Diagnosis not present

## 2021-08-31 MED ORDER — NALTREXONE HCL 50 MG PO TABS
ORAL_TABLET | ORAL | 1 refills | Status: DC
  Filled 2021-08-31: qty 30, 30d supply, fill #0

## 2021-09-05 ENCOUNTER — Encounter: Payer: Self-pay | Admitting: Family Medicine

## 2021-09-13 ENCOUNTER — Ambulatory Visit: Payer: 59

## 2021-09-15 ENCOUNTER — Other Ambulatory Visit: Payer: Self-pay

## 2021-09-15 MED FILL — Escitalopram Oxalate Tab 10 MG (Base Equiv): ORAL | 90 days supply | Qty: 90 | Fill #0 | Status: AC

## 2021-09-16 ENCOUNTER — Ambulatory Visit (INDEPENDENT_AMBULATORY_CARE_PROVIDER_SITE_OTHER): Payer: 59

## 2021-09-16 DIAGNOSIS — I10 Essential (primary) hypertension: Secondary | ICD-10-CM

## 2021-09-16 LAB — BASIC METABOLIC PANEL
BUN: 14 mg/dL (ref 6–23)
CO2: 30 mEq/L (ref 19–32)
Calcium: 10.3 mg/dL (ref 8.4–10.5)
Chloride: 97 mEq/L (ref 96–112)
Creatinine, Ser: 0.82 mg/dL (ref 0.40–1.50)
GFR: 96.89 mL/min (ref >60.00)
Glucose, Bld: 106 mg/dL — ABNORMAL HIGH (ref 70–99)
Potassium: 4.3 mEq/L (ref 3.5–5.1)
Sodium: 133 mEq/L — ABNORMAL LOW (ref 135–145)

## 2021-09-20 ENCOUNTER — Other Ambulatory Visit: Payer: Self-pay

## 2021-09-20 DIAGNOSIS — E871 Hypo-osmolality and hyponatremia: Secondary | ICD-10-CM

## 2021-09-29 ENCOUNTER — Encounter: Payer: Self-pay | Admitting: Internal Medicine

## 2021-09-29 ENCOUNTER — Ambulatory Visit: Payer: 59 | Admitting: Internal Medicine

## 2021-09-29 VITALS — BP 120/90 | HR 68 | Ht 67.0 in | Wt 204.0 lb

## 2021-09-29 DIAGNOSIS — I10 Essential (primary) hypertension: Secondary | ICD-10-CM

## 2021-09-29 DIAGNOSIS — R0789 Other chest pain: Secondary | ICD-10-CM | POA: Diagnosis not present

## 2021-09-29 DIAGNOSIS — R0609 Other forms of dyspnea: Secondary | ICD-10-CM

## 2021-09-29 DIAGNOSIS — R011 Cardiac murmur, unspecified: Secondary | ICD-10-CM

## 2021-09-29 NOTE — Progress Notes (Signed)
? ?New Outpatient Visit ?Date: 09/29/2021 ? ?Referring Provider: ?Leone Haven, MD ?[address] ?[address] ?[place],  [place] [address] ? ?Chief Complaint: Shortness of breath ? ?HPI:  Stuart Bruce is a 61 y.o. male who is being seen today for the evaluation of dyspnea on exertion at the request of Dr. Caryl Bis. He has a history of hypertension, obstructive sleep apnea, depression, and alcohol use.  Mr. [name] reports that over the last year, he has noticed shortness of breath when he gets up at night and walks to the bathroom or kitchen to get something to drink.  He typically does not have any exertional dyspnea during the day, including when doing strenuous activities such as climbing stairs.  Every once while, he gets a "ache" in the left side of his chest that lasts 5 minutes or less and is not associated with exertion or other particular activity.  Prior to [place], he was running 5K's and would like to get back into this. ? ?[name] denies prior heart testing other than EKGs.  He has not had any palpitations or lightheadedness.  He notes having had ankle swelling in the past, though this resolved with initiation of lisinopril/HCTZ. ? ?-------------------------------------------------------------------------------------------------- ? ?Cardiovascular History & Procedures: ?Cardiovascular Problems: ?Dyspnea on exertion ?Atypical chest pain ? ?Risk Factors: ?Hypertension, male gender, obesity, and age greater than 51 ? ?Cath/PCI: ?None ? ?CV Surgery: ?None ? ?EP Procedures and Devices: ?None ? ?Non-Invasive Evaluation(s): ?None ? ?Recent CV Pertinent Labs: ?Lab Results  ?Component Value Date  ? CHOL 257 (H) 04/26/2021  ? HDL 54.10 04/26/2021  ? LDLCALC 166 (H) 04/26/2021  ? TRIG 187.0 (H) 04/26/2021  ? CHOLHDL 5 04/26/2021  ? INR 1.0 02/17/2021  ? BNP 86.6 02/17/2021  ? K 4.3 09/16/2021  ? MG 2.9 (H) 02/18/2021  ? BUN 14 09/16/2021  ? CREATININE 0.82 09/16/2021   ? ? ?-------------------------------------------------------------------------------------------------- ? ?Past Medical History:  ?Diagnosis Date  ? Depression   ? Hypertension   ? OSA (obstructive sleep apnea)   ? no CPAP use  ? ? ?Past Surgical History:  ?Procedure Laterality Date  ? HAND SURGERY Right 06/06/2003  ? Cyst on ring finger  ? ROTATOR CUFF REPAIR Right   ? ? ?Current Meds  ?Medication Sig  ? Ascorbic Acid (VITAMIN C) 1000 MG tablet Take 1,000 mg by mouth daily.  ? ASPIRIN LOW DOSE 81 MG chewable tablet Chew 81 mg by mouth daily.  ? B Complex Vitamins (B COMPLEX 100 PO) Take 1 capsule by mouth daily.  ? escitalopram (LEXAPRO) 10 MG tablet TAKE 1 TABLET(10 MG) BY MOUTH DAILY  ? lisinopril-hydrochlorothiazide (ZESTORETIC) 20-25 MG tablet Take 1 tablet by mouth daily.  ? naltrexone (DEPADE) 50 MG tablet Take 1 oral tablet once a day  ? ?Current Facility-Administered Medications for the 09/29/21 encounter (Office Visit) with Abigale Dorow, Harrell Gave, MD  ?Medication  ? 0.9 %  sodium chloride infusion  ? ? ?Allergies: Patient has no known allergies. ? ?Social History  ? ?Tobacco Use  ? Smoking status: Never  ? Smokeless tobacco: Never  ?Vaping Use  ? Vaping Use: Never used  ?Substance Use Topics  ? Alcohol use: Not Currently  ?  Comment: 2-4 drinksonce a week (beer/wine, & occ. 1 shot liquor)  ? Drug use: No  ? ? ?Family History  ?Problem Relation Age of Onset  ? Cancer Mother   ?     Breast  ? Diabetes Father   ? Cancer Father   ?  Renal  ? Drug abuse Paternal Aunt   ? Colon cancer Neg Hx   ? ? ?Review of Systems: ?A 12-system review of systems was performed and was negative except as noted in the HPI. ? ?-------------------------------------------------------------------------------------------------- ? ?Physical Exam: ?BP 120/90 (BP Location: Right Arm, Patient Position: Sitting, Cuff Size: Normal)   Pulse 68   Ht '5\' 7"'$  (1.702 m)   Wt 204 lb (92.5 kg)   SpO2 98%   BMI 31.95 kg/m?  ? ?General: NAD. ?HEENT:  No conjunctival pallor or scleral icterus. Facemask in place. ?Neck: Supple without lymphadenopathy, thyromegaly, JVD, or HJR. No carotid bruit. ?Lungs: Normal work of breathing. Clear to auscultation bilaterally without wheezes or crackles. ?Heart: Regular rate and rhythm with 2/6 holosystolic murmur.  No rubs or gallops.  Nondisplaced PMI. ?Abd: Bowel sounds present. Soft, NT/ND without hepatosplenomegaly ?Ext: No lower extremity edema. Radial, PT, and DP pulses are 2+ bilaterally ?Skin: Warm and dry without rash. ?Neuro: CNIII-XII intact. Strength and fine-touch sensation intact in upper and lower extremities bilaterally. ?Psych: Normal mood and affect. ? ?EKG: Normal sinus rhythm with left anterior fascicular block.  No significant change from prior tracing on 08/16/2021. ? ?Lab Results  ?Component Value Date  ? WBC 6.4 08/16/2021  ? HGB 14.8 08/16/2021  ? HCT 45.0 08/16/2021  ? MCV 95.6 08/16/2021  ? PLT 236.0 08/16/2021  ? ? ?Lab Results  ?Component Value Date  ? NA 133 (L) 09/16/2021  ? K 4.3 09/16/2021  ? CL 97 09/16/2021  ? CO2 30 09/16/2021  ? BUN 14 09/16/2021  ? CREATININE 0.82 09/16/2021  ? GLUCOSE 106 (H) 09/16/2021  ? ALT 20 08/16/2021  ? ? ?Lab Results  ?Component Value Date  ? CHOL 257 (H) 04/26/2021  ? HDL 54.10 04/26/2021  ? LDLCALC 166 (H) 04/26/2021  ? TRIG 187.0 (H) 04/26/2021  ? CHOLHDL 5 04/26/2021  ? ? ? ?-------------------------------------------------------------------------------------------------- ? ?ASSESSMENT AND PLAN: ?Dyspnea on exertion, atypical chest pain, and heart murmur: ?Mr. [name] reports unusual shortness of breath usually only present when he gets up in the middle the night.  He is able to do more strenuous activities during the day without significant shortness of breath.  He also reports sporadic left-sided chest aches that are not exertional.  His examination today is notable for a soft systolic murmur.  His EKG shows left anterior fascicular block but otherwise no  significant abnormalities.  Given several cardiac risk factors including hypertension, male gender, obesity, and age greater than 23, I have recommended that we obtain an echocardiogram.  If this does not show any significant structural abnormalities, we will then pursue an exercise tolerance test.  I think it is reasonable for Mr. Chapple to continue low-dose aspirin for now.  We will defer other medication changes at this time. ? ?Hypertension: ?Blood pressure borderline elevated today with DBP of 90.  We will continue lisinopril-HCTZ as previously prescribed.  Sodium restriction encouraged. ? ?Shared Decision Making/Informed Consent ?The risks [chest pain, shortness of breath, cardiac arrhythmias, dizziness, blood pressure fluctuations, myocardial infarction, stroke/transient ischemic attack, and life-threatening complications (estimated to be 1 in 10,000)], benefits (risk stratification, diagnosing coronary artery disease, treatment guidance) and alternatives of an exercise tolerance test were discussed in detail with Mr. Ruan and he agrees to proceed. ? ?Follow-up: Return to clinic in 2 months. ? ?Nelva Bush, MD ?09/29/2021 ?3:35 PM ? ?

## 2021-09-29 NOTE — Patient Instructions (Signed)
Medication Instructions:  ? ?Your physician recommends that you continue on your current medications as directed. Please refer to the Current Medication list given to you today. ? ?*If you need a refill on your cardiac medications before your next appointment, please call your pharmacy* ? ? ?Lab Work: ? ?None ordered ? ?Testing/Procedures: ? ?Your physician has requested that you have an echocardiogram. Echocardiography is a painless test that uses sound waves to create images of your heart. It provides your doctor with information about the size and shape of your heart and how well your heart?s chambers and valves are working. This procedure takes approximately one hour. There are no restrictions for this procedure. ? ? ?Follow-Up: ?At [facility], you and your health needs are our priority.  As part of our continuing mission to provide you with exceptional heart care, we have created designated Provider Care Teams.  These Care Teams include your primary Cardiologist (physician) and Advanced Practice Providers (APPs -  Physician Assistants and Nurse Practitioners) who all work together to provide you with the care you need, when you need it. ? ?We recommend signing up for the patient portal called "MyChart".  Sign up information is provided on this After Visit Summary.  MyChart is used to connect with patients for Virtual Visits (Telemedicine).  Patients are able to view lab/test results, encounter notes, upcoming appointments, etc.  Non-urgent messages can be sent to your provider as well.   ?To learn more about what you can do with MyChart, go to [email or web address].   ? ?Your next appointment:   ?2 month(s) ? ?The format for your next appointment:   ?In Person ? ?Provider:   ?You may see Dr. Harrell Gave End or one of the following Advanced Practice Providers on your designated Care Team:   ?Murray Hodgkins, NP ?Christell Faith, PA-C ?Cadence Kathlen Mody, PA-C{ ? ? ?Important Information About Sugar ? ? ? ? ? ? ?

## 2021-09-30 ENCOUNTER — Encounter: Payer: Self-pay | Admitting: Internal Medicine

## 2021-09-30 DIAGNOSIS — R0789 Other chest pain: Secondary | ICD-10-CM | POA: Insufficient documentation

## 2021-09-30 DIAGNOSIS — R011 Cardiac murmur, unspecified: Secondary | ICD-10-CM | POA: Insufficient documentation

## 2021-10-04 ENCOUNTER — Other Ambulatory Visit (INDEPENDENT_AMBULATORY_CARE_PROVIDER_SITE_OTHER): Payer: 59

## 2021-10-04 ENCOUNTER — Ambulatory Visit (INDEPENDENT_AMBULATORY_CARE_PROVIDER_SITE_OTHER): Payer: 59

## 2021-10-04 DIAGNOSIS — R011 Cardiac murmur, unspecified: Secondary | ICD-10-CM

## 2021-10-04 DIAGNOSIS — R0609 Other forms of dyspnea: Secondary | ICD-10-CM | POA: Diagnosis not present

## 2021-10-04 DIAGNOSIS — E871 Hypo-osmolality and hyponatremia: Secondary | ICD-10-CM | POA: Diagnosis not present

## 2021-10-04 LAB — ECHOCARDIOGRAM COMPLETE
AR max vel: 3.69 cm2
AV Area VTI: 3.41 cm2
AV Area mean vel: 3.52 cm2
AV Mean grad: 7 mmHg
AV Peak grad: 13.1 mmHg
Ao pk vel: 1.81 m/s
Area-P 1/2: 2.2 cm2
Calc EF: 59.3 %
Single Plane A2C EF: 58.3 %
Single Plane A4C EF: 60.2 %

## 2021-10-04 LAB — BASIC METABOLIC PANEL
BUN: 8 mg/dL (ref 6–23)
CO2: 30 mEq/L (ref 19–32)
Calcium: 9.4 mg/dL (ref 8.4–10.5)
Chloride: 97 mEq/L (ref 96–112)
Creatinine, Ser: 1.18 mg/dL (ref 0.40–1.50)
GFR: 68.04 mL/min (ref >60.00)
Glucose, Bld: 92 mg/dL (ref 70–99)
Potassium: 4.4 mEq/L (ref 3.5–5.1)
Sodium: 138 mEq/L (ref 135–145)

## 2021-10-06 ENCOUNTER — Telehealth: Payer: Self-pay | Admitting: *Deleted

## 2021-10-06 DIAGNOSIS — R0609 Other forms of dyspnea: Secondary | ICD-10-CM

## 2021-10-06 DIAGNOSIS — R0789 Other chest pain: Secondary | ICD-10-CM

## 2021-10-06 NOTE — Telephone Encounter (Signed)
-----   Message from Nelva Bush, MD sent at 10/06/2021 12:29 PM EDT ----- ?Normal LVEF with mild intracavitary gradient likely explaining the patient's heart murmur.  No significant valvular abnormality identified.  I recommend that he continue his current medications and that we move forward with exercise tolerance test, as discussed at our recent visit. ?

## 2021-10-06 NOTE — Telephone Encounter (Signed)
Patient is returning call.  °

## 2021-10-06 NOTE — Telephone Encounter (Signed)
Attempted to call pt. No answer. Lmtcb.  ? ?Your physician has requested that you have an exercise tolerance test. For further information please visit [email or web address]. Please also follow instruction sheet, as given. ? ? ?- you may eat a light breakfast/ lunch prior to your procedure ?- no caffeine for 24 hours prior to your test (coffee, tea, soft drinks, or chocolate)  ?- no smoking/ vaping for 4 hours prior to your test ?- you may take your regular medications the day of your test. ?- bring any inhalers with you to your test ?- wear comfortable clothing & tennis/ non-skid shoes to walk on the treadmill ? ?

## 2021-10-07 NOTE — Telephone Encounter (Signed)
Called and spoke with pt, notified of echo results and Dr. Darnelle Bos recc.  ?Pt agreeable to proceed with ETT. Instructions reviewed below.  ?Pt aware that someone from our office will call him soon to schedule date/time.  ?Pt has no further questions at this time.  ? ?Your physician has requested that you have an exercise tolerance test. For further information please visit [email or web address]. Please also follow instruction sheet, as given. ?  ?  ?- you may eat a light breakfast/ lunch prior to your procedure ?- no caffeine for 24 hours prior to your test (coffee, tea, soft drinks, or chocolate)  ?- no smoking/ vaping for 4 hours prior to your test ?- you may take your regular medications the day of your test. ?- bring any inhalers with you to your test ?- wear comfortable clothing & tennis/ non-skid shoes to walk on the treadmill ?

## 2021-10-14 ENCOUNTER — Telehealth: Payer: Self-pay | Admitting: Internal Medicine

## 2021-10-14 NOTE — Telephone Encounter (Signed)
Patient is scheduled for a GXT on Monday 10/17/21 at 2:00 pm. ? ?Attempted to call the patient to remind him of his appointment and pre-test instructions. ?No answer- I left a detailed message of the information below (ok per DPR). ?I asked that he call back with any further questions or concerns. ? ? ?Appt Monday 10/17/21 at 2:00 pm (arrive at 1:45 pm) at our office. ? ?- you may eat a light breakfast/ lunch prior to your procedure ?- no caffeine for 24 hours prior to your test (coffee, tea, soft drinks, or chocolate)  ?- no smoking/ vaping for 4 hours prior to your test ?- you may take your regular medications the day of your test ?- bring any inhalers with you to your test ?- wear comfortable clothing & tennis/ non-skid shoes to walk on the treadmill ? ? ?

## 2021-10-17 ENCOUNTER — Ambulatory Visit: Payer: 59

## 2021-10-17 DIAGNOSIS — R0609 Other forms of dyspnea: Secondary | ICD-10-CM

## 2021-10-17 DIAGNOSIS — R0789 Other chest pain: Secondary | ICD-10-CM | POA: Diagnosis not present

## 2021-10-18 LAB — EXERCISE TOLERANCE TEST
Angina Index: 0
Duke Treadmill Score: 8
Estimated workload: 10.1
Exercise duration (min): 8 min
Exercise duration (sec): 1 s
MPHR: 162 {beats}/min
Peak HR: 151 {beats}/min
Percent HR: 93 %
RPE: 12
Rest HR: 112 {beats}/min
ST Depression (mm): 0 mm

## 2021-10-27 ENCOUNTER — Other Ambulatory Visit: Payer: Self-pay

## 2021-10-27 DIAGNOSIS — F330 Major depressive disorder, recurrent, mild: Secondary | ICD-10-CM | POA: Diagnosis not present

## 2021-10-27 DIAGNOSIS — F1020 Alcohol dependence, uncomplicated: Secondary | ICD-10-CM | POA: Diagnosis not present

## 2021-10-27 MED ORDER — NALTREXONE HCL 50 MG PO TABS
ORAL_TABLET | ORAL | 0 refills | Status: DC
  Filled 2021-10-27: qty 90, 90d supply, fill #0

## 2021-10-28 ENCOUNTER — Other Ambulatory Visit: Payer: Self-pay

## 2021-11-21 ENCOUNTER — Ambulatory Visit: Payer: 59 | Admitting: Family Medicine

## 2021-11-21 ENCOUNTER — Encounter: Payer: Self-pay | Admitting: Family Medicine

## 2021-11-21 ENCOUNTER — Other Ambulatory Visit: Payer: Self-pay

## 2021-11-21 VITALS — BP 126/80 | HR 62 | Temp 98.4°F | Resp 12 | Ht 67.0 in | Wt 209.6 lb

## 2021-11-21 DIAGNOSIS — I10 Essential (primary) hypertension: Secondary | ICD-10-CM | POA: Diagnosis not present

## 2021-11-21 DIAGNOSIS — F1010 Alcohol abuse, uncomplicated: Secondary | ICD-10-CM | POA: Diagnosis not present

## 2021-11-21 DIAGNOSIS — S46219A Strain of muscle, fascia and tendon of other parts of biceps, unspecified arm, initial encounter: Secondary | ICD-10-CM | POA: Insufficient documentation

## 2021-11-21 DIAGNOSIS — F3341 Major depressive disorder, recurrent, in partial remission: Secondary | ICD-10-CM

## 2021-11-21 DIAGNOSIS — Z8669 Personal history of other diseases of the nervous system and sense organs: Secondary | ICD-10-CM | POA: Diagnosis not present

## 2021-11-21 DIAGNOSIS — R7303 Prediabetes: Secondary | ICD-10-CM

## 2021-11-21 MED ORDER — LISINOPRIL-HYDROCHLOROTHIAZIDE 20-12.5 MG PO TABS
2.0000 | ORAL_TABLET | Freq: Every day | ORAL | 3 refills | Status: DC
  Filled 2021-11-21: qty 180, 90d supply, fill #0
  Filled 2022-07-06: qty 180, 90d supply, fill #1

## 2021-11-21 NOTE — Assessment & Plan Note (Signed)
Asymptomatic after weight loss.  He will monitor for any recurrence of symptoms.

## 2021-11-21 NOTE — Assessment & Plan Note (Signed)
Patient's exam is consistent with a biceps tendon tear.  Discussed referral to orthopedics for further evaluation.

## 2021-11-21 NOTE — Patient Instructions (Signed)
Nice to see you. We are going to increase your dose of lisinopril.  We will check lab work in 1 week and see you back in 1 month. Orthopedics should contact you to schedule evaluation for your right bicep.  If you do not hear from them in the next 1 to 2 weeks please let us know.

## 2021-11-21 NOTE — Assessment & Plan Note (Signed)
Improving.  He will continue Lexapro 10 mg daily.

## 2021-11-21 NOTE — Assessment & Plan Note (Signed)
The patient is in recovery.  He will continue his outpatient treatment.

## 2021-11-21 NOTE — Progress Notes (Signed)
[name], MD Phone: [phone]  Stuart Bruce is a 61 y.o. male who presents today for follow-up.  Hypertension: Typically 130s over 80s.  He is taking lisinopril and HCTZ.  No chest pain, shortness of breath, or edema.  History of alcohol abuse: Patient has not had an alcoholic beverage since 8/30.  He is still looking for the right AA group.  He is also looking for a therapist to help.  He is seeing a PA specialist who prescribes his naltrexone.  History of OSA: Patient reports no hypersomnia.  He does generally wake up well rested.  Depression: Reports this is better.  He is occasionally bummed out though overall this has improved.  No SI.  He is on Lexapro.  Bicep lump: Patient noticed this over the weekend.  There is a lump in his right bicep area.  No injury or pain associated with this.  He was moving lots of furniture.  He has had rotator cuff surgery in the past.  Social History   Tobacco Use  Smoking Status Never  Smokeless Tobacco Never    Current Outpatient Medications on File Prior to Visit  Medication Sig Dispense Refill   Ascorbic Acid (VITAMIN C) 1000 MG tablet Take 1,000 mg by mouth daily.     ASPIRIN LOW DOSE 81 MG chewable tablet Chew 81 mg by mouth daily.     B Complex Vitamins (B COMPLEX 100 PO) Take 1 capsule by mouth daily.     escitalopram (LEXAPRO) 10 MG tablet TAKE 1 TABLET(10 MG) BY MOUTH DAILY 90 tablet 1   naltrexone (DEPADE) 50 MG tablet Take 1 oral tablet once a day 90 tablet 0   naltrexone (DEPADE) 50 MG tablet Take 1 oral tablet once a day (Patient not taking: Reported on 11/21/2021) 30 tablet 1   Current Facility-Administered Medications on File Prior to Visit  Medication Dose Route Frequency Provider Last Rate Last Admin   0.9 %  sodium chloride infusion  500 mL Intravenous Continuous Pyrtle, Lajuan Lines, MD         ROS see history of present illness  Objective  Physical Exam Vitals:   11/21/21 1131  BP: 126/80  Pulse: 62  Resp: 12   Temp: 98.4 F (36.9 C)  SpO2: 99%    BP Readings from Last 3 Encounters:  11/21/21 126/80  09/29/21 120/90  08/16/21 138/79   Wt Readings from Last 3 Encounters:  11/21/21 209 lb 9.6 oz (95.1 kg)  09/29/21 204 lb (92.5 kg)  08/16/21 205 lb 3.2 oz (93.1 kg)    Physical Exam Constitutional:      General: He is not in acute distress.    Appearance: He is not diaphoretic.  Cardiovascular:     Rate and Rhythm: Normal rate and regular rhythm.     Heart sounds: Normal heart sounds.  Pulmonary:     Effort: Pulmonary effort is normal.     Breath sounds: Normal breath sounds.  Musculoskeletal:     Comments: Lump noted in his right bicep area that is nontender, there is a palpable gap above this lump where his bicep muscle should be  Skin:    General: Skin is warm and dry.  Neurological:     Mental Status: He is alert.      Assessment/Plan: Please see individual problem list.  Problem List Items Addressed This Visit     Alcohol abuse (Chronic)    The patient is in recovery.  He will continue his outpatient treatment.  Benign essential HTN - Primary (Chronic)    Above goal.  We will increase his lisinopril to 40 mg daily.  He will continue HCTZ 25 mg daily.  We will check a BMP in 1 week.  He will return to see me for his blood pressure in 1 month.      Relevant Medications   lisinopril-hydrochlorothiazide (ZESTORETIC) 20-12.5 MG tablet   Other Relevant Orders   Basic Metabolic Panel (BMET)   Major depression (Chronic)    Improving.  He will continue Lexapro 10 mg daily.      Biceps tendon tear    Patient's exam is consistent with a biceps tendon tear.  Discussed referral to orthopedics for further evaluation.      Relevant Orders   Ambulatory referral to Orthopedic Surgery   History of sleep apnea    Asymptomatic after weight loss.  He will monitor for any recurrence of symptoms.      Prediabetes   Relevant Orders   Hemoglobin A1c     Return in about  1 week (around 11/28/2021) for Labs, 1 month PCP for hypertension.   [name], MD [facility]

## 2021-11-21 NOTE — Assessment & Plan Note (Signed)
Above goal.  We will increase his lisinopril to 40 mg daily.  He will continue HCTZ 25 mg daily.  We will check a BMP in 1 week.  He will return to see me for his blood pressure in 1 month.

## 2021-11-23 ENCOUNTER — Encounter: Payer: Self-pay | Admitting: Internal Medicine

## 2021-11-28 ENCOUNTER — Other Ambulatory Visit (INDEPENDENT_AMBULATORY_CARE_PROVIDER_SITE_OTHER): Payer: 59

## 2021-11-28 DIAGNOSIS — R7303 Prediabetes: Secondary | ICD-10-CM

## 2021-11-28 DIAGNOSIS — I10 Essential (primary) hypertension: Secondary | ICD-10-CM

## 2021-11-28 LAB — BASIC METABOLIC PANEL
BUN: 14 mg/dL (ref 6–23)
CO2: 29 mEq/L (ref 19–32)
Calcium: 9.4 mg/dL (ref 8.4–10.5)
Chloride: 97 mEq/L (ref 96–112)
Creatinine, Ser: 0.9 mg/dL (ref 0.40–1.50)
GFR: 94.07 mL/min (ref >60.00)
Glucose, Bld: 101 mg/dL — ABNORMAL HIGH (ref 70–99)
Potassium: 4 mEq/L (ref 3.5–5.1)
Sodium: 136 mEq/L (ref 135–145)

## 2021-11-28 LAB — HEMOGLOBIN A1C: Hgb A1c MFr Bld: 5.8 % (ref 4.6–6.5)

## 2021-11-29 DIAGNOSIS — M66821 Spontaneous rupture of other tendons, right upper arm: Secondary | ICD-10-CM | POA: Diagnosis not present

## 2021-12-09 ENCOUNTER — Ambulatory Visit: Payer: 59 | Admitting: Internal Medicine

## 2021-12-09 ENCOUNTER — Encounter: Payer: Self-pay | Admitting: Internal Medicine

## 2021-12-09 VITALS — BP 120/76 | HR 81 | Ht 67.0 in | Wt 215.0 lb

## 2021-12-09 DIAGNOSIS — R0609 Other forms of dyspnea: Secondary | ICD-10-CM

## 2021-12-09 DIAGNOSIS — E782 Mixed hyperlipidemia: Secondary | ICD-10-CM

## 2021-12-09 DIAGNOSIS — I10 Essential (primary) hypertension: Secondary | ICD-10-CM

## 2021-12-09 NOTE — Progress Notes (Signed)
Follow-up Outpatient Visit Date: 12/09/2021  Primary Care Provider: Leone Haven, MD [address] [place] [address]  Chief Complaint: Follow-up dyspnea on exertion  HPI:  Stuart Bruce is a 61 y.o. male with history of hypertension, obstructive sleep apnea, depression, and alcohol use, who presents for follow-up of shortness of breath.  I met him in April, at which time he complained of dyspnea when getting up at night to go to the bathroom or walking to the kitchen.  He also noted occasional aching in the left chest that would happen randomly.  Heart murmur was incidentally noted on exam.  Subsequent echo showed normal LVEF with mild intracavitary gradient.  No significant valvular abnormality was seen.  Subsequent exercise tolerance test was low risk without evidence of ischemia, though test was somewhat limited by motion artifact.  Today, Mr. Oros reports that he has been feeling well.  He has not had any further episodes of shortness of breath.  He also denies chest pain, palpitations, lightheadedness, and edema.  He has not started training for a 5K yet but plans to do so soon.  He notes that his home blood pressures are typically in the 120s over 80s.  Dr. Caryl Bis has asked him to increase lisinopril when his current lisinopril-HCTZ prescription runs out.  He is trying to wean down his alcohol use and hopes to quit completely at some point.  --------------------------------------------------------------------------------------------------  Cardiovascular History & Procedures: Cardiovascular Problems: Dyspnea on exertion Atypical chest pain   Risk Factors: Hypertension, male gender, obesity, and age greater than 41   Cath/PCI: None   CV Surgery: None   EP Procedures and Devices: None   Non-Invasive Evaluation(s): Exercise tolerance test (10/17/2021): Low risk study without evidence of inducible ischemia.  Sensitivity and specificity reduced by motion  artifact during stress and early recovery.  Hypertensive blood pressure response noted. Transthoracic echocardiogram (10/04/2021): Normal LV size with mild LVH.  LVEF 60-65% with grade 1 diastolic dysfunction.  Normal wall motion.  Normal RV size and function.  Normal biatrial size.  Moderate mitral annular calcification without regurgitation or stenosis noted.  Normal aortic, tricuspid, and pulmonic valves.  Borderline enlargement of aortic root (3.7 cm).  Normal CVP.  Recent CV Pertinent Labs: Lab Results  Component Value Date   CHOL 257 (H) 04/26/2021   HDL 54.10 04/26/2021   LDLCALC 166 (H) 04/26/2021   TRIG 187.0 (H) 04/26/2021   CHOLHDL 5 04/26/2021   INR 1.0 02/17/2021   BNP 86.6 02/17/2021   K 4.0 11/28/2021   MG 2.9 (H) 02/18/2021   BUN 14 11/28/2021   CREATININE 0.90 11/28/2021    Past medical and surgical history were reviewed and updated in EPIC.  Current Meds  Medication Sig   Ascorbic Acid (VITAMIN C) 1000 MG tablet Take 1,000 mg by mouth daily.   ASPIRIN LOW DOSE 81 MG chewable tablet Chew 81 mg by mouth daily.   B Complex Vitamins (B COMPLEX 100 PO) Take 1 capsule by mouth daily.   escitalopram (LEXAPRO) 10 MG tablet TAKE 1 TABLET(10 MG) BY MOUTH DAILY   lisinopril-hydrochlorothiazide (ZESTORETIC) 20-12.5 MG tablet Take 2 tablets by mouth daily.   naltrexone (DEPADE) 50 MG tablet Take 1 oral tablet once a day   Current Facility-Administered Medications for the 12/09/21 encounter (Office Visit) with Abreanna Drawdy, Harrell Gave, MD  Medication   0.9 %  sodium chloride infusion    Allergies: Patient has no known allergies.  Social History   Tobacco Use  Smoking status: Never   Smokeless tobacco: Never  Vaping Use   Vaping Use: Never used  Substance Use Topics   Alcohol use: Yes    Alcohol/week: 6.0 - 10.0 standard drinks of alcohol    Types: 6 - 10 Standard drinks or equivalent per week   Drug use: No    Family History  Problem Relation Age of Onset   Heart disease  Mother        Valve replacement   Cancer Mother        Breast   Diabetes Father    Cancer Father        Renal   Drug abuse Paternal Aunt    Colon cancer Neg Hx     Review of Systems: A 12-system review of systems was performed and was negative except as noted in the HPI.  --------------------------------------------------------------------------------------------------  Physical Exam: BP 120/76 (BP Location: Left Arm, Patient Position: Sitting, Cuff Size: Large)   Pulse 81   Ht 5' 7"  (1.702 m)   Wt 215 lb (97.5 kg)   SpO2 98%   BMI 33.67 kg/m   General:  NAD. Neck: No JVD or HJR. Lungs: Clear to auscultation bilaterally without wheezes or crackles. Heart: Regular rate and rhythm with 1/6 systolic murmur. Abdomen: Soft, nontender, nondistended. Extremities: No lower extremity edema.   Lab Results  Component Value Date   WBC 6.4 08/16/2021   HGB 14.8 08/16/2021   HCT 45.0 08/16/2021   MCV 95.6 08/16/2021   PLT 236.0 08/16/2021    Lab Results  Component Value Date   NA 136 11/28/2021   K 4.0 11/28/2021   CL 97 11/28/2021   CO2 29 11/28/2021   BUN 14 11/28/2021   CREATININE 0.90 11/28/2021   GLUCOSE 101 (H) 11/28/2021   ALT 20 08/16/2021    Lab Results  Component Value Date   CHOL 257 (H) 04/26/2021   HDL 54.10 04/26/2021   LDLCALC 166 (H) 04/26/2021   TRIG 187.0 (H) 04/26/2021   CHOLHDL 5 04/26/2021    --------------------------------------------------------------------------------------------------  ASSESSMENT AND PLAN: Dyspnea on exertion: No further symptoms reported.  Echocardiogram and exercise tolerance test were reassuring.  We will defer further work-up.  Continue blood pressure control per Dr. Caryl Bis.  Hypertension: Blood pressure well controlled today.  Ongoing management per Dr. Caryl Bis.  Hyperlipidemia: LDL moderately elevated on last check in November.  Triglycerides mildly elevated.  Continue to work on lifestyle  modifications.  Ongoing follow-up per Dr. Caryl Bis.  Follow-up: Return to clinic in 1 year.  Nelva Bush, MD 12/09/2021 8:57 AM

## 2021-12-09 NOTE — Patient Instructions (Signed)
Medication Instructions:   Your physician recommends that you continue on your current medications as directed. Please refer to the Current Medication list given to you today.  *If you need a refill on your cardiac medications before your next appointment, please call your pharmacy*   Lab Work:  None ordered  Testing/Procedures:  None ordered   Follow-Up: At [facility], you and your health needs are our priority.  As part of our continuing mission to provide you with exceptional heart care, we have created designated Provider Care Teams.  These Care Teams include your primary Cardiologist (physician) and Advanced Practice Providers (APPs -  Physician Assistants and Nurse Practitioners) who all work together to provide you with the care you need, when you need it.  We recommend signing up for the patient portal called "MyChart".  Sign up information is provided on this After Visit Summary.  MyChart is used to connect with patients for Virtual Visits (Telemedicine).  Patients are able to view lab/test results, encounter notes, upcoming appointments, etc.  Non-urgent messages can be sent to your provider as well.   To learn more about what you can do with MyChart, go to [email or web address].    Your next appointment:   1 year(s)  The format for your next appointment:   In Person  Provider:   You may see Dr. Harrell Gave End or one of the following Advanced Practice Providers on your designated Care Team:   Murray Hodgkins, NP Christell Faith, PA-C Cadence Kathlen Mody, PA-C{   Important Information About Sugar

## 2021-12-21 ENCOUNTER — Ambulatory Visit: Payer: 59 | Admitting: Family Medicine

## 2021-12-21 ENCOUNTER — Encounter: Payer: Self-pay | Admitting: Family Medicine

## 2021-12-21 ENCOUNTER — Telehealth: Payer: Self-pay | Admitting: Family Medicine

## 2021-12-21 DIAGNOSIS — I10 Essential (primary) hypertension: Secondary | ICD-10-CM

## 2021-12-21 DIAGNOSIS — Z1211 Encounter for screening for malignant neoplasm of colon: Secondary | ICD-10-CM

## 2021-12-21 NOTE — Assessment & Plan Note (Signed)
Improved on recheck.  Blood pressure is at goal at home.  He will continue lisinopril-HCTZ 40-25 mg daily.  He did ask about avoiding prolonged sun exposure as this was listed on his medication bottle with his most recent fill.  On review of up-to-date it looks like photosensitivity is a possible side effect.  I discussed adequate sun protection with sunscreen or long sleeves and a large hat.

## 2021-12-21 NOTE — Progress Notes (Signed)
  [name], MD Phone: [phone]  Stuart Bruce is a 61 y.o. male who presents today for f/u.  HYPERTENSION Disease Monitoring Home BP Monitoring <130/80 Chest pain- no    Dyspnea- no Medications Compliance-  taking lisinopril/HCTZ.   Edema- no BMET    Component Value Date/Time   NA 136 11/28/2021 1103   K 4.0 11/28/2021 1103   CL 97 11/28/2021 1103   CO2 29 11/28/2021 1103   GLUCOSE 101 (H) 11/28/2021 1103   BUN 14 11/28/2021 1103   CREATININE 0.90 11/28/2021 1103   CALCIUM 9.4 11/28/2021 1103   GFRNONAA >60 02/17/2021 1729   GFRAA >60 07/09/2019 1852     Social History   Tobacco Use  Smoking Status Never  Smokeless Tobacco Never    Current Outpatient Medications on File Prior to Visit  Medication Sig Dispense Refill   Ascorbic Acid (VITAMIN C) 1000 MG tablet Take 1,000 mg by mouth daily.     ASPIRIN LOW DOSE 81 MG chewable tablet Chew 81 mg by mouth daily.     B Complex Vitamins (B COMPLEX 100 PO) Take 1 capsule by mouth daily.     escitalopram (LEXAPRO) 10 MG tablet TAKE 1 TABLET(10 MG) BY MOUTH DAILY 90 tablet 1   lisinopril-hydrochlorothiazide (ZESTORETIC) 20-12.5 MG tablet Take 2 tablets by mouth daily. 180 tablet 3   naltrexone (DEPADE) 50 MG tablet Take 1 oral tablet once a day 90 tablet 0   Current Facility-Administered Medications on File Prior to Visit  Medication Dose Route Frequency Provider Last Rate Last Admin   0.9 %  sodium chloride infusion  500 mL Intravenous Continuous Pyrtle, Lajuan Lines, MD         ROS see history of present illness  Objective  Physical Exam Vitals:   12/21/21 1035 12/21/21 1042  BP: 140/90 118/80  Pulse: 64   Temp: 98.4 F (36.9 C)   SpO2: 98%     BP Readings from Last 3 Encounters:  12/21/21 118/80  12/09/21 120/76  11/21/21 126/80   Wt Readings from Last 3 Encounters:  12/21/21 214 lb (97.1 kg)  12/09/21 215 lb (97.5 kg)  11/21/21 209 lb 9.6 oz (95.1 kg)    Physical Exam Constitutional:      General:  He is not in acute distress.    Appearance: He is not diaphoretic.  Cardiovascular:     Rate and Rhythm: Normal rate and regular rhythm.     Heart sounds: Normal heart sounds.  Pulmonary:     Effort: Pulmonary effort is normal.     Breath sounds: Normal breath sounds.  Skin:    General: Skin is warm and dry.  Neurological:     Mental Status: He is alert.      Assessment/Plan: Please see individual problem list.  Problem List Items Addressed This Visit     Benign essential HTN (Chronic)    Improved on recheck.  Blood pressure is at goal at home.  He will continue lisinopril-HCTZ 40-25 mg daily.  He did ask about avoiding prolonged sun exposure as this was listed on his medication bottle with his most recent fill.  On review of up-to-date it looks like photosensitivity is a possible side effect.  I discussed adequate sun protection with sunscreen or long sleeves and a large hat.        Return in about 3 months (around 03/23/2022) for htn.   [name], MD [facility]

## 2021-12-21 NOTE — Patient Instructions (Signed)
Nice to see you. Please make sure you wear plenty of sunscreen or other skin protection when out in the sun while on your BP medication.

## 2021-12-21 NOTE — Addendum Note (Signed)
Addended by: Caryl Bis, Devanshi Califf G on: 12/21/2021 11:15 AM   Modules accepted: Orders

## 2021-12-21 NOTE — Telephone Encounter (Signed)
Referral placed.

## 2021-12-21 NOTE — Telephone Encounter (Signed)
Please call the patient. I noted after he left that he is due for a colonoscopy. I can refer him for this if he is ok with it.

## 2022-01-04 ENCOUNTER — Encounter: Payer: Self-pay | Admitting: Internal Medicine

## 2022-01-19 ENCOUNTER — Other Ambulatory Visit: Payer: Self-pay

## 2022-01-19 DIAGNOSIS — F1020 Alcohol dependence, uncomplicated: Secondary | ICD-10-CM | POA: Diagnosis not present

## 2022-01-19 DIAGNOSIS — F330 Major depressive disorder, recurrent, mild: Secondary | ICD-10-CM | POA: Diagnosis not present

## 2022-01-19 MED ORDER — NALTREXONE HCL 50 MG PO TABS
ORAL_TABLET | ORAL | 0 refills | Status: DC
  Filled 2022-01-19: qty 60, 60d supply, fill #0
  Filled 2022-01-23: qty 30, 30d supply, fill #0

## 2022-01-20 ENCOUNTER — Other Ambulatory Visit: Payer: Self-pay

## 2022-01-23 ENCOUNTER — Other Ambulatory Visit: Payer: Self-pay

## 2022-02-09 ENCOUNTER — Other Ambulatory Visit: Payer: Self-pay

## 2022-02-09 ENCOUNTER — Ambulatory Visit (AMBULATORY_SURGERY_CENTER): Payer: Self-pay | Admitting: *Deleted

## 2022-02-09 VITALS — Ht 67.0 in | Wt 212.0 lb

## 2022-02-09 DIAGNOSIS — Z86010 Personal history of colonic polyps: Secondary | ICD-10-CM

## 2022-02-09 MED ORDER — NA SULFATE-K SULFATE-MG SULF 17.5-3.13-1.6 GM/177ML PO SOLN
1.0000 | Freq: Once | ORAL | 0 refills | Status: AC
  Filled 2022-02-09: qty 354, 1d supply, fill #0

## 2022-02-09 NOTE — Progress Notes (Signed)
No egg or soy allergy known to patient  No issues known to pt with past sedation with any surgeries or procedures Patient denies ever being told they had issues or difficulty with intubation  No FH of Malignant Hyperthermia Pt is not on diet pills Pt is not on  home 02  Pt is not on blood thinners  Pt denies issues with constipation  No A fib or A flutter Have any cardiac testing pending--no Pt instructed to use [email or web address] or GoodRx for a price reduction on prep   

## 2022-02-13 ENCOUNTER — Other Ambulatory Visit: Payer: Self-pay | Admitting: Family Medicine

## 2022-02-13 ENCOUNTER — Encounter: Payer: Self-pay | Admitting: Internal Medicine

## 2022-02-14 ENCOUNTER — Other Ambulatory Visit: Payer: Self-pay | Admitting: Family Medicine

## 2022-02-14 ENCOUNTER — Other Ambulatory Visit: Payer: Self-pay

## 2022-02-14 MED FILL — Escitalopram Oxalate Tab 10 MG (Base Equiv): ORAL | 90 days supply | Qty: 90 | Fill #0 | Status: AC

## 2022-02-27 ENCOUNTER — Encounter: Payer: Self-pay | Admitting: Internal Medicine

## 2022-02-27 ENCOUNTER — Ambulatory Visit (AMBULATORY_SURGERY_CENTER): Payer: 59 | Admitting: Internal Medicine

## 2022-02-27 VITALS — BP 120/72 | HR 65 | Temp 98.6°F | Resp 19 | Ht 67.0 in | Wt 212.0 lb

## 2022-02-27 DIAGNOSIS — D128 Benign neoplasm of rectum: Secondary | ICD-10-CM | POA: Diagnosis not present

## 2022-02-27 DIAGNOSIS — Z86010 Personal history of colonic polyps: Secondary | ICD-10-CM

## 2022-02-27 DIAGNOSIS — D125 Benign neoplasm of sigmoid colon: Secondary | ICD-10-CM

## 2022-02-27 DIAGNOSIS — I10 Essential (primary) hypertension: Secondary | ICD-10-CM | POA: Diagnosis not present

## 2022-02-27 DIAGNOSIS — Z09 Encounter for follow-up examination after completed treatment for conditions other than malignant neoplasm: Secondary | ICD-10-CM | POA: Diagnosis not present

## 2022-02-27 DIAGNOSIS — K5140 Inflammatory polyps of colon without complications: Secondary | ICD-10-CM | POA: Diagnosis not present

## 2022-02-27 DIAGNOSIS — R7303 Prediabetes: Secondary | ICD-10-CM | POA: Diagnosis not present

## 2022-02-27 DIAGNOSIS — D124 Benign neoplasm of descending colon: Secondary | ICD-10-CM

## 2022-02-27 DIAGNOSIS — K621 Rectal polyp: Secondary | ICD-10-CM | POA: Diagnosis not present

## 2022-02-27 MED ORDER — SODIUM CHLORIDE 0.9 % IV SOLN: 500.0000 mL | Freq: Once | INTRAVENOUS | Status: AC

## 2022-02-27 NOTE — Progress Notes (Signed)
Vital signs checked by:JF  The patient states no changes in medical or surgical history since pre-visit screening on 02/09/22.

## 2022-02-27 NOTE — Op Note (Signed)
[facility] Patient Name: Stuart Bruce Procedure Date: 02/27/2022 11:07 AM MRN: [id number] Endoscopist: Jerene Bears , MD Age: 61 Referring MD:  Date of Birth: 05-31-1961 Gender: Male Account #: [id number] Procedure:                Colonoscopy Indications:              High risk colon cancer surveillance: Personal                            history of non-advanced adenoma, Last colonoscopy:                            April 2018 Medicines:                Monitored Anesthesia Care Procedure:                Pre-Anesthesia Assessment:                           - Prior to the procedure, a History and Physical                            was performed, and patient medications and                            allergies were reviewed. The patient's tolerance of                            previous anesthesia was also reviewed. The risks                            and benefits of the procedure and the sedation                            options and risks were discussed with the patient.                            All questions were answered, and informed consent                            was obtained. Prior Anticoagulants: The patient has                            taken no previous anticoagulant or antiplatelet                            agents. ASA Grade Assessment: III - A patient with                            severe systemic disease. After reviewing the risks                            and benefits, the patient was deemed in  satisfactory condition to undergo the procedure.                           After obtaining informed consent, the colonoscope                            was passed under direct vision. Throughout the                            procedure, the patient's blood pressure, pulse, and                            oxygen saturations were monitored continuously. The                            CF [id number] #[id number] was introduced through the anus                             and advanced to the cecum, identified by                            appendiceal orifice and ileocecal valve. The                            colonoscopy was performed without difficulty. The                            patient tolerated the procedure well. The quality                            of the bowel preparation was good. The ileocecal                            valve, appendiceal orifice, and rectum were                            photographed. Scope In: 11:18:14 AM Scope Out: 11:35:47 AM Scope Withdrawal Time: 0 hours 16 minutes 14 seconds  Total Procedure Duration: 0 hours 17 minutes 33 seconds  Findings:                 The digital rectal exam was normal.                           A 6 mm polyp was found in the descending colon. The                            polyp was sessile. The polyp was removed with a                            cold snare. Resection and retrieval were complete.                           A 4 mm polyp was found in the sigmoid colon. The  polyp was sessile. The polyp was removed with a                            cold snare. Resection and retrieval were complete.                           A 2 mm polyp was found in the distal rectum. The                            polyp was sessile. The polyp was removed with a                            cold snare. Resection and retrieval were complete.                           Internal hemorrhoids were found during                            retroflexion. The hemorrhoids were medium-sized. Complications:            No immediate complications. Estimated Blood Loss:     Estimated blood loss was minimal. Impression:               - One 6 mm polyp in the descending colon, removed                            with a cold snare. Resected and retrieved.                           - One 4 mm polyp in the sigmoid colon, removed with                            a cold snare. Resected and  retrieved.                           - One 2 mm polyp in the distal rectum, removed with                            a cold snare. Resected and retrieved.                           - Internal hemorrhoids. Recommendation:           - Patient has a contact number available for                            emergencies. The signs and symptoms of potential                            delayed complications were discussed with the                            patient. Return to normal activities tomorrow.  Written discharge instructions were provided to the                            patient.                           - Resume previous diet.                           - Continue present medications.                           - Await pathology results.                           - Repeat colonoscopy is recommended for                            surveillance. The colonoscopy date will be                            determined after pathology results from today's                            exam become available for review. Jerene Bears, MD 02/27/2022 11:38:26 AM This report has been signed electronically.

## 2022-02-27 NOTE — Progress Notes (Signed)
Pt resting comfortably. VSS. Airway intact. SBAR complete to RN. All questions answered.   

## 2022-02-27 NOTE — Progress Notes (Signed)
GASTROENTEROLOGY PROCEDURE H&P NOTE   Primary Care Physician: Leone Haven, MD    Reason for Procedure:  History of adenomatous colon polyps  Plan:    Colonoscopy  Patient is appropriate for endoscopic procedure(s) in the ambulatory ([facility]) setting.  The nature of the procedure, as well as the risks, benefits, and alternatives were carefully and thoroughly reviewed with the patient. Ample time for discussion and questions allowed. The patient understood, was satisfied, and agreed to proceed.     HPI: Stuart Bruce is a 61 y.o. male who presents for surveillance colonoscopy.  Medical history as below.  Tolerated the prep.  No recent chest pain or shortness of breath.  No abdominal pain today.  Past Medical History:  Diagnosis Date   Depression    Hypertension    OSA (obstructive sleep apnea)    no CPAP use   Sleep apnea    Substance abuse (HCC)    etoh    Past Surgical History:  Procedure Laterality Date   COLONOSCOPY     HAND SURGERY Right 06/06/2003   Cyst on ring finger   POLYPECTOMY     ROTATOR CUFF REPAIR Right     Prior to Admission medications   Medication Sig Start Date End Date Taking? Authorizing Provider  Ascorbic Acid (VITAMIN C) 1000 MG tablet Take 1,000 mg by mouth daily.   Yes [provider]  ASPIRIN LOW DOSE 81 MG chewable tablet Chew 81 mg by mouth daily. 01/24/21  Yes [provider]  B Complex Vitamins (B COMPLEX 100 PO) Take 1 capsule by mouth daily.   Yes [provider]  escitalopram (LEXAPRO) 10 MG tablet TAKE 1 TABLET(10 MG) BY MOUTH DAILY 02/14/22  Yes Dutch Quint B, FNP  lisinopril-hydrochlorothiazide (ZESTORETIC) 20-12.5 MG tablet Take 2 tablets by mouth daily. 11/21/21  Yes Leone Haven, MD  naltrexone (DEPADE) 50 MG tablet Take 1 oral tablet once a day 01/19/22  Yes     Current Outpatient Medications  Medication Sig Dispense Refill   Ascorbic Acid (VITAMIN C) 1000 MG tablet Take 1,000 mg by mouth  daily.     ASPIRIN LOW DOSE 81 MG chewable tablet Chew 81 mg by mouth daily.     B Complex Vitamins (B COMPLEX 100 PO) Take 1 capsule by mouth daily.     escitalopram (LEXAPRO) 10 MG tablet TAKE 1 TABLET(10 MG) BY MOUTH DAILY 90 tablet 1   lisinopril-hydrochlorothiazide (ZESTORETIC) 20-12.5 MG tablet Take 2 tablets by mouth daily. 180 tablet 3   naltrexone (DEPADE) 50 MG tablet Take 1 oral tablet once a day 90 tablet 0   Current Facility-Administered Medications  Medication Dose Route Frequency Provider Last Rate Last Admin   0.9 %  sodium chloride infusion  500 mL Intravenous Once Maty Zeisler, Lajuan Lines, MD        Allergies as of 02/27/2022   (No Known Allergies)    Family History  Problem Relation Age of Onset   Heart disease Mother        Valve replacement   Cancer Mother        Breast   Diabetes Father    Cancer Father        Renal   Drug abuse Paternal Aunt    Colon cancer Neg Hx    Colon polyps Neg Hx    Crohn's disease Neg Hx    Esophageal cancer Neg Hx    Rectal cancer Neg Hx    Stomach cancer Neg Hx  Ulcerative colitis Neg Hx     Social History   Socioeconomic History   Marital status: Married    Spouse name: Not on file   Number of children: Not on file   Years of education: Not on file   Highest education level: Not on file  Occupational History   Not on file  Tobacco Use   Smoking status: Never    Passive exposure: Never   Smokeless tobacco: Never  Vaping Use   Vaping Use: Never used  Substance and Sexual Activity   Alcohol use: Yes    Alcohol/week: 6.0 standard drinks of alcohol    Types: 6 Standard drinks or equivalent per week    Comment: 2-3 x a week   Drug use: No   Sexual activity: Yes    Partners: Female    Comment: Wife- post-menopausal   Other Topics Concern   Not on file  Social History Narrative   Works for [employer] in [employer] with wife and two daughters   Pets: 1 dog lives inside    Caffeine- 2 cups of coffee, no soda/tea     Enjoys playing golf, reading   Right handed   Social Determinants of Health   Financial Resource Strain: Not on file  Food Insecurity: Not on file  Transportation Needs: Not on file  Physical Activity: Not on file  Stress: Not on file  Social Connections: Not on file  Intimate Partner Violence: Not on file    Physical Exam: Vital signs in last 24 hours: '@BP'$  (!) 156/102   Pulse 73   Temp 98.6 F (37 C)   Resp 13   Ht '5\' 7"'$  (1.702 m)   Wt 212 lb (96.2 kg)   SpO2 100%   BMI 33.20 kg/m  GEN: NAD EYE: Sclerae anicteric ENT: MMM CV: Non-tachycardic Pulm: CTA b/l GI: Soft, NT/ND NEURO:  Alert & Oriented x 3   [name], MD [facility]  02/27/2022 11:10 AM

## 2022-02-27 NOTE — Patient Instructions (Signed)
Impression/Recommendations:  Polyp and hemorrhoid handouts given to patient.  Resume previous diet. Continue present medications. Await pathology results.  Repeat colonoscopy recommended for surveillance.  Date to be determined after pathology results reviewed.  YOU HAD AN ENDOSCOPIC PROCEDURE TODAY AT THE [facility]:   Refer to the procedure report that was given to you for any specific questions about what was found during the examination.  If the procedure report does not answer your questions, please call your gastroenterologist to clarify.  If you requested that your care partner not be given the details of your procedure findings, then the procedure report has been included in a sealed envelope for you to review at your convenience later.  YOU SHOULD EXPECT: Some feelings of bloating in the abdomen. Passage of more gas than usual.  Walking can help get rid of the air that was put into your GI tract during the procedure and reduce the bloating. If you had a lower endoscopy (such as a colonoscopy or flexible sigmoidoscopy) you may notice spotting of blood in your stool or on the toilet paper. If you underwent a bowel prep for your procedure, you may not have a normal bowel movement for a few days.  Please Note:  You might notice some irritation and congestion in your nose or some drainage.  This is from the oxygen used during your procedure.  There is no need for concern and it should clear up in a day or so.  SYMPTOMS TO REPORT IMMEDIATELY:  Following lower endoscopy (colonoscopy or flexible sigmoidoscopy):  Excessive amounts of blood in the stool  Significant tenderness or worsening of abdominal pains  Swelling of the abdomen that is new, acute  Fever of 100F or higher For urgent or emergent issues, a gastroenterologist can be reached at any hour by calling [phone]. Do not use MyChart messaging for urgent concerns.    DIET:  We do recommend a small meal at  first, but then you may proceed to your regular diet.  Drink plenty of fluids but you should avoid alcoholic beverages for 24 hours.  ACTIVITY:  You should plan to take it easy for the rest of today and you should NOT DRIVE or use heavy machinery until tomorrow (because of the sedation medicines used during the test).    FOLLOW UP: Our staff will call the number listed on your records the next business day following your procedure.  We will call around 7:15- 8:00 am to check on you and address any questions or concerns that you may have regarding the information given to you following your procedure. If we do not reach you, we will leave a message.     If any biopsies were taken you will be contacted by phone or by letter within the next 1-3 weeks.  Please call us at [phone] if you have not heard about the biopsies in 3 weeks.    SIGNATURES/CONFIDENTIALITY: You and/or your care partner have signed paperwork which will be entered into your electronic medical record.  These signatures attest to the fact that that the information above on your After Visit Summary has been reviewed and is understood.  Full responsibility of the confidentiality of this discharge information lies with you and/or your care-partner. 

## 2022-02-27 NOTE — Progress Notes (Signed)
Called to room to assist during endoscopic procedure.  Patient ID and intended procedure confirmed with present staff. Received instructions for my participation in the procedure from the performing physician.  

## 2022-02-28 ENCOUNTER — Telehealth: Payer: Self-pay | Admitting: *Deleted

## 2022-02-28 NOTE — Telephone Encounter (Signed)
Attempt for follow up phone call. No answer at number given.  Left message on voicemail.   

## 2022-03-02 ENCOUNTER — Encounter: Payer: Self-pay | Admitting: Internal Medicine

## 2022-04-03 ENCOUNTER — Ambulatory Visit: Payer: 59 | Admitting: Family Medicine

## 2022-04-03 ENCOUNTER — Encounter: Payer: Self-pay | Admitting: Family Medicine

## 2022-04-03 VITALS — BP 118/78 | HR 54 | Temp 98.2°F | Ht 67.0 in | Wt 216.8 lb

## 2022-04-03 DIAGNOSIS — Z23 Encounter for immunization: Secondary | ICD-10-CM

## 2022-04-03 DIAGNOSIS — F1010 Alcohol abuse, uncomplicated: Secondary | ICD-10-CM

## 2022-04-03 DIAGNOSIS — I10 Essential (primary) hypertension: Secondary | ICD-10-CM

## 2022-04-03 DIAGNOSIS — F3341 Major depressive disorder, recurrent, in partial remission: Secondary | ICD-10-CM | POA: Diagnosis not present

## 2022-04-03 DIAGNOSIS — R29898 Other symptoms and signs involving the musculoskeletal system: Secondary | ICD-10-CM | POA: Diagnosis not present

## 2022-04-03 NOTE — Assessment & Plan Note (Signed)
Well-controlled. Continue to take Lisinopril-HCTZ 40-'25mg'$  daily.

## 2022-04-03 NOTE — Assessment & Plan Note (Addendum)
Well-controlled. Continue Lexapro 10 mg daily.

## 2022-04-03 NOTE — Progress Notes (Signed)
Stuart Rumps, MD Phone: [phone]  Stuart Bruce is a 61 y.o. male who presents today for follow-up.  Leg fatigue: Patient reports that his legs have been sore and fatigued since he came back from his trip to [place] around two weeks ago. He stated that they hiked and walked frequently on the trip with few breaks. He mentioned that he was previously inactive prior to his vacation. The leg fatigue bothers him the most when going from a sitting to a standing position. He reports no difficulty walking at his part-time job.  Hypertension: Patient reports that he is taking his lisinopril/HCTZ daily and checks blood pressures at home. They typically run at 120s/80s. He denies chest pain, shortness of breath, and swelling in his lower extremities.   History of alcohol abuse: Patient has not had an alcoholic beverage since February 24th. He currently attends AA group online, and is looking for a therapist. He continues to see a NP for his naltrexone prescriptions.  Depression: reports that this is better on Lexapro. Denies side effects from Lexapro and denies SI.   Social History   Tobacco Use  Smoking Status Never   Passive exposure: Never  Smokeless Tobacco Never    Current Outpatient Medications on File Prior to Visit  Medication Sig Dispense Refill   Ascorbic Acid (VITAMIN C) 1000 MG tablet Take 1,000 mg by mouth daily.     ASPIRIN LOW DOSE 81 MG chewable tablet Chew 81 mg by mouth daily.     B Complex Vitamins (B COMPLEX 100 PO) Take 1 capsule by mouth daily.     escitalopram (LEXAPRO) 10 MG tablet TAKE 1 TABLET(10 MG) BY MOUTH DAILY 90 tablet 1   lisinopril-hydrochlorothiazide (ZESTORETIC) 20-12.5 MG tablet Take 2 tablets by mouth daily. 180 tablet 3   naltrexone (DEPADE) 50 MG tablet Take 1 oral tablet once a day 90 tablet 0   Current Facility-Administered Medications on File Prior to Visit  Medication Dose Route Frequency Provider Last Rate Last Admin   0.9 %  sodium chloride  infusion  500 mL Intravenous Once Pyrtle, Lajuan Lines, MD         ROS see history of present illness  Objective  Vitals:   04/03/22 1043  BP: 118/78  Pulse: (!) 54  Temp: 98.2 F (36.8 C)  SpO2: 97%    BP Readings from Last 3 Encounters:  04/03/22 118/78  02/27/22 120/72  12/21/21 118/80   Wt Readings from Last 3 Encounters:  04/03/22 216 lb 12.8 oz (98.3 kg)  02/27/22 212 lb (96.2 kg)  02/09/22 212 lb (96.2 kg)    Physical Exam HENT:     Head: Normocephalic and atraumatic.  Cardiovascular:     Rate and Rhythm: Normal rate and regular rhythm.     Pulses: Normal pulses.     Comments: 2+ DP and PT pulses bilaterally Pulmonary:     Effort: Pulmonary effort is normal.     Breath sounds: Normal breath sounds.  Musculoskeletal:        General: No swelling or signs of injury.  Skin:    General: Skin is warm and dry.  Neurological:     Mental Status: He is alert.  Psychiatric:        Mood and Affect: Mood normal.    Assessment/Plan: Please see individual problem list.  Problem List Items Addressed This Visit     Alcohol abuse (Chronic)    Last drink was 2/24. Congratulated patient. Encouraged patient to continue attending AA  groups and find a therapist. Continue to follow with his specialist for the naltrexone.       Benign essential HTN - Primary (Chronic)    Well-controlled. Continue to take Lisinopril-HCTZ 40-'25mg'$  daily.       Major depression (Chronic)    Well-controlled. Continue Lexapro 10 mg daily.      Leg fatigue    Most likely from recent vacation to [place] and sudden increase in physical activity when previously inactive prior to trip. Could also be attributed to naltrexone as a side effect. Advised patient that if the leg fatigue is not better in a week or so to contact PCP or contact naltrexone prescriber.      Other Visit Diagnoses     Need for immunization against influenza       Relevant Orders   Flu Vaccine QUAD 75moIM (Fluarix, Fluzone &  Alfiuria Quad PF) (Completed)        Return in about 3 months (around 07/04/2022) for Hypertension.   ETommi Bruce Medical Student  [place]

## 2022-04-03 NOTE — Patient Instructions (Signed)
Nice to see you. Please let us know if your leg symptoms are not improving over the next week or 2.

## 2022-04-03 NOTE — Assessment & Plan Note (Addendum)
Last drink was 2/24. Congratulated patient. Encouraged patient to continue attending AA groups and find a therapist. Continue to follow with his specialist for the naltrexone.

## 2022-04-03 NOTE — Progress Notes (Signed)
Patient seen along with medical student Zada Zhong.  I personally evaluated this patient along with the student, and verified all aspects of the history, physical exam, and medical decision making as documented by the student.  I agree with the student's documentation and have made all necessary edits.  Orene Abbasi, MD  

## 2022-04-03 NOTE — Assessment & Plan Note (Signed)
Most likely from recent vacation to [place] and sudden increase in physical activity when previously inactive prior to trip. Could also be attributed to naltrexone as a side effect. Advised patient that if the leg fatigue is not better in a week or so to contact PCP or contact naltrexone prescriber.

## 2022-04-03 NOTE — Progress Notes (Signed)
fol

## 2022-04-20 ENCOUNTER — Other Ambulatory Visit: Payer: Self-pay

## 2022-04-20 DIAGNOSIS — F330 Major depressive disorder, recurrent, mild: Secondary | ICD-10-CM | POA: Diagnosis not present

## 2022-04-20 DIAGNOSIS — F1020 Alcohol dependence, uncomplicated: Secondary | ICD-10-CM | POA: Diagnosis not present

## 2022-04-20 MED ORDER — NALTREXONE HCL 50 MG PO TABS
50.0000 mg | ORAL_TABLET | Freq: Every day | ORAL | 0 refills | Status: DC
  Filled 2022-04-20 – 2022-05-19 (×3): qty 90, 90d supply, fill #0

## 2022-04-21 ENCOUNTER — Other Ambulatory Visit: Payer: Self-pay

## 2022-04-24 ENCOUNTER — Other Ambulatory Visit: Payer: Self-pay

## 2022-05-08 ENCOUNTER — Other Ambulatory Visit: Payer: Self-pay

## 2022-05-09 ENCOUNTER — Other Ambulatory Visit: Payer: Self-pay

## 2022-05-16 ENCOUNTER — Other Ambulatory Visit: Payer: Self-pay

## 2022-05-19 ENCOUNTER — Other Ambulatory Visit: Payer: Self-pay

## 2022-06-20 ENCOUNTER — Other Ambulatory Visit: Payer: Self-pay

## 2022-06-20 DIAGNOSIS — F1020 Alcohol dependence, uncomplicated: Secondary | ICD-10-CM | POA: Diagnosis not present

## 2022-06-20 DIAGNOSIS — F330 Major depressive disorder, recurrent, mild: Secondary | ICD-10-CM | POA: Diagnosis not present

## 2022-06-20 MED ORDER — NALTREXONE HCL 50 MG PO TABS
50.0000 mg | ORAL_TABLET | Freq: Every day | ORAL | 0 refills | Status: DC
  Filled 2022-06-20: qty 30, 30d supply, fill #0

## 2022-07-04 ENCOUNTER — Ambulatory Visit (INDEPENDENT_AMBULATORY_CARE_PROVIDER_SITE_OTHER): Payer: 59 | Admitting: Family Medicine

## 2022-07-04 ENCOUNTER — Encounter: Payer: Self-pay | Admitting: Family Medicine

## 2022-07-04 VITALS — BP 115/75 | HR 75 | Temp 98.3°F | Ht 67.0 in | Wt 216.4 lb

## 2022-07-04 DIAGNOSIS — E519 Thiamine deficiency, unspecified: Secondary | ICD-10-CM | POA: Diagnosis not present

## 2022-07-04 DIAGNOSIS — E782 Mixed hyperlipidemia: Secondary | ICD-10-CM | POA: Diagnosis not present

## 2022-07-04 DIAGNOSIS — I10 Essential (primary) hypertension: Secondary | ICD-10-CM

## 2022-07-04 DIAGNOSIS — Z125 Encounter for screening for malignant neoplasm of prostate: Secondary | ICD-10-CM

## 2022-07-04 DIAGNOSIS — R7303 Prediabetes: Secondary | ICD-10-CM

## 2022-07-04 DIAGNOSIS — F3341 Major depressive disorder, recurrent, in partial remission: Secondary | ICD-10-CM

## 2022-07-04 LAB — COMPREHENSIVE METABOLIC PANEL
ALT: 19 U/L (ref 0–53)
AST: 17 U/L (ref 0–37)
Albumin: 4.5 g/dL (ref 3.5–5.2)
Alkaline Phosphatase: 40 U/L (ref 39–117)
BUN: 14 mg/dL (ref 6–23)
CO2: 26 mEq/L (ref 19–32)
Calcium: 9.5 mg/dL (ref 8.4–10.5)
Chloride: 98 mEq/L (ref 96–112)
Creatinine, Ser: 0.81 mg/dL (ref 0.40–1.50)
GFR: 96.71 mL/min (ref >60.00)
Glucose, Bld: 119 mg/dL — ABNORMAL HIGH (ref 70–99)
Potassium: 4.1 mEq/L (ref 3.5–5.1)
Sodium: 134 mEq/L — ABNORMAL LOW (ref 135–145)
Total Bilirubin: 0.6 mg/dL (ref 0.2–1.2)
Total Protein: 7.1 g/dL (ref 6.0–8.3)

## 2022-07-04 LAB — LIPID PANEL
Cholesterol: 250 mg/dL — ABNORMAL HIGH (ref 0–200)
HDL: 46.2 mg/dL (ref >39.00)
LDL Cholesterol: 176 mg/dL — ABNORMAL HIGH (ref 0–99)
NonHDL: 204.27
Total CHOL/HDL Ratio: 5
Triglycerides: 140 mg/dL (ref 0.0–149.0)
VLDL: 28 mg/dL (ref 0.0–40.0)

## 2022-07-04 LAB — PSA: PSA: 1.32 ng/mL (ref 0.10–4.00)

## 2022-07-04 LAB — HEMOGLOBIN A1C: Hgb A1c MFr Bld: 6.2 % (ref 4.6–6.5)

## 2022-07-04 NOTE — Progress Notes (Signed)
Stuart Rumps, MD Phone: [phone]  Stuart Bruce is a 62 y.o. male who presents today for f/u.  HYPERTENSION Disease Monitoring Home BP Monitoring similar Chest pain- no    Dyspnea- no Medications Compliance-  taking lisinopril, HCTZ.   Edema- no BMET    Component Value Date/Time   NA 136 11/28/2021 1103   K 4.0 11/28/2021 1103   CL 97 11/28/2021 1103   CO2 29 11/28/2021 1103   GLUCOSE 101 (H) 11/28/2021 1103   BUN 14 11/28/2021 1103   CREATININE 0.90 11/28/2021 1103   CALCIUM 9.4 11/28/2021 1103   GFRNONAA >60 02/17/2021 1729   GFRAA >60 07/09/2019 1852   Depression: Patient notes no depression.  No anxiety.  No SI.  He is on Lexapro.  Social History   Tobacco Use  Smoking Status Never   Passive exposure: Never  Smokeless Tobacco Never    Current Outpatient Medications on File Prior to Visit  Medication Sig Dispense Refill   Ascorbic Acid (VITAMIN C) 1000 MG tablet Take 1,000 mg by mouth daily.     ASPIRIN LOW DOSE 81 MG chewable tablet Chew 81 mg by mouth daily.     B Complex Vitamins (B COMPLEX 100 PO) Take 1 capsule by mouth daily.     escitalopram (LEXAPRO) 10 MG tablet TAKE 1 TABLET(10 MG) BY MOUTH DAILY 90 tablet 1   lisinopril-hydrochlorothiazide (ZESTORETIC) 20-12.5 MG tablet Take 2 tablets by mouth daily. 180 tablet 3   naltrexone (DEPADE) 50 MG tablet Take 1 tablet (50 mg total) by mouth daily. 30 tablet 0   Current Facility-Administered Medications on File Prior to Visit  Medication Dose Route Frequency Provider Last Rate Last Admin   0.9 %  sodium chloride infusion  500 mL Intravenous Once Pyrtle, Lajuan Lines, MD         ROS see history of present illness  Objective  Physical Exam Vitals:   07/04/22 1130  BP: 115/75  Pulse: 75  Temp: 98.3 F (36.8 C)  SpO2: 97%    BP Readings from Last 3 Encounters:  07/04/22 115/75  04/03/22 118/78  02/27/22 120/72   Wt Readings from Last 3 Encounters:  07/04/22 216 lb 6.4 oz (98.2 kg)  04/03/22 216  lb 12.8 oz (98.3 kg)  02/27/22 212 lb (96.2 kg)    Physical Exam Constitutional:      General: He is not in acute distress.    Appearance: He is not diaphoretic.  Cardiovascular:     Rate and Rhythm: Normal rate and regular rhythm.     Heart sounds: Normal heart sounds.  Pulmonary:     Effort: Pulmonary effort is normal.     Breath sounds: Normal breath sounds.  Skin:    General: Skin is warm and dry.  Neurological:     Mental Status: He is alert.      Assessment/Plan: Please see individual problem list.  Benign essential HTN Assessment & Plan: Chronic issue.  Well-controlled.  Patient will continue lisinopril-HCTZ 2 tablets daily.  Check lab work.   Recurrent major depressive disorder, in partial remission ([place]) Assessment & Plan: Chronic issue.  Well-controlled.  Continue Lexapro 10 mg daily.   Thiamine deficiency -     Vitamin B1  Prediabetes -     Hemoglobin A1c  Mixed hyperlipidemia -     Comprehensive metabolic panel -     Lipid panel  Prostate cancer screening -     PSA     Return in about 6 months (  around 01/02/2023) for physical.   Stuart Rumps, MD [facility]

## 2022-07-04 NOTE — Assessment & Plan Note (Signed)
Chronic issue.  Well-controlled.  Patient will continue lisinopril-HCTZ 2 tablets daily.  Check lab work.

## 2022-07-04 NOTE — Assessment & Plan Note (Signed)
Chronic issue.  Well-controlled.  Continue Lexapro 10 mg daily.

## 2022-07-08 LAB — VITAMIN B1: Vitamin B1 (Thiamine): 11 nmol/L (ref 8–30)

## 2022-08-07 IMAGING — CT CT HEAD CODE STROKE
3 series · 14 of 47 positions shown, 16 images · non-contrast
Comparison: 12/24/2020.

CLINICAL DATA: Code stroke.  Cerebral hemorrhage suspected

EXAM:
CT HEAD WITHOUT CONTRAST
TECHNIQUE: Contiguous axial images were obtained from the base of the skull
through the vertex without intravenous contrast.

[Series 3: head wo · axial · 0.45mm/px · z∈[-113,+17]mm · 8 of 32 slices shown, 10 images]
[im 3/32  brain]
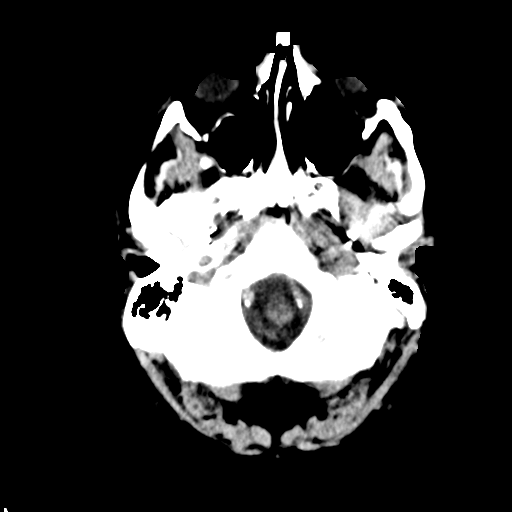
[im 3/32  bone]
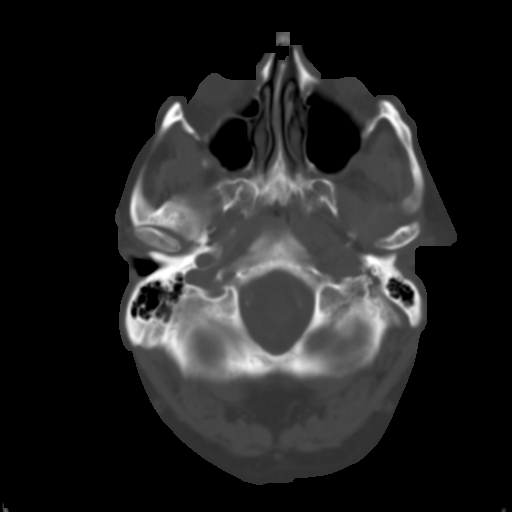
[im 7/32  brain]
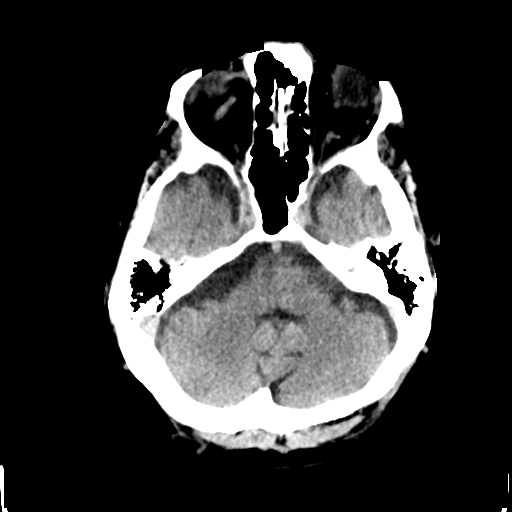
[im 10/32  brain]
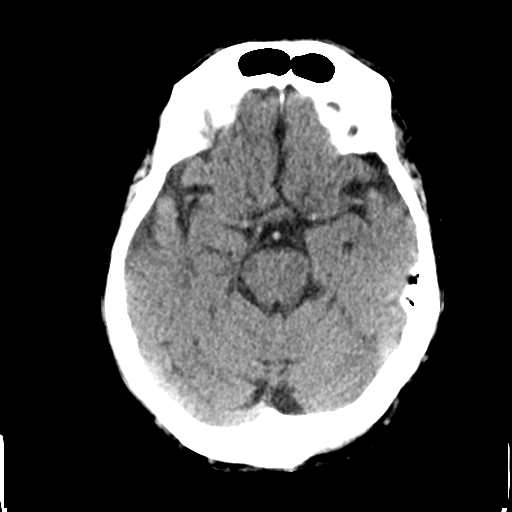
[im 14/32  brain]
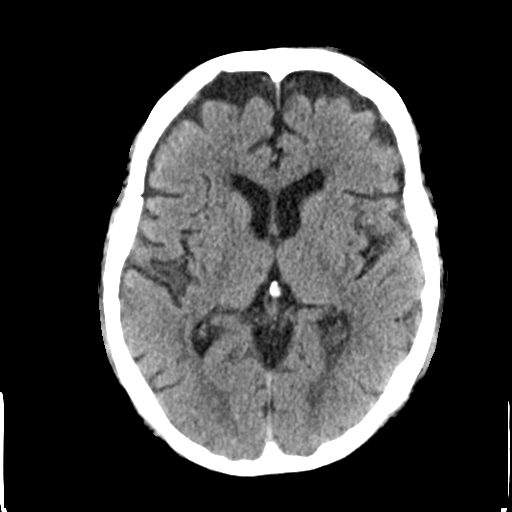
[im 18/32  brain]
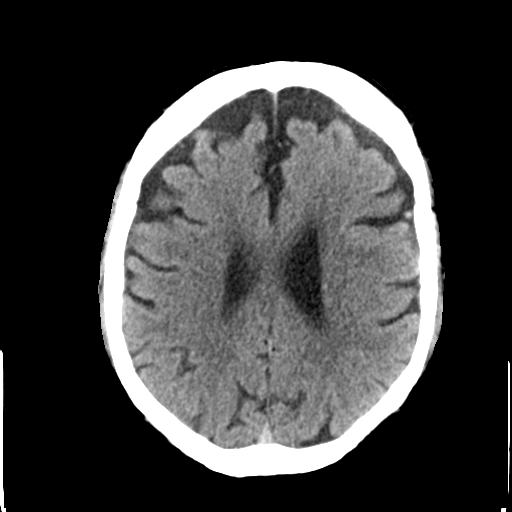
[im 18/32  bone]
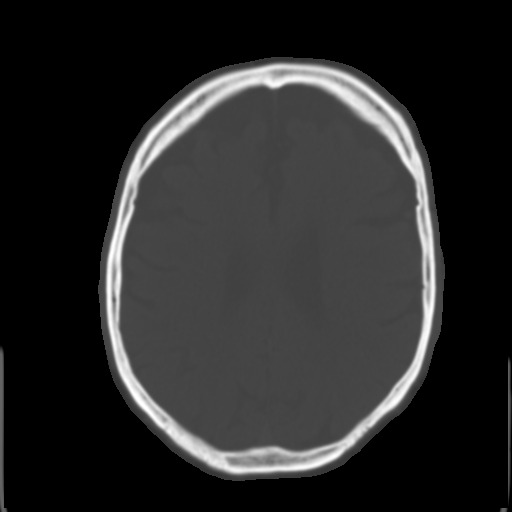
[im 22/32  brain]
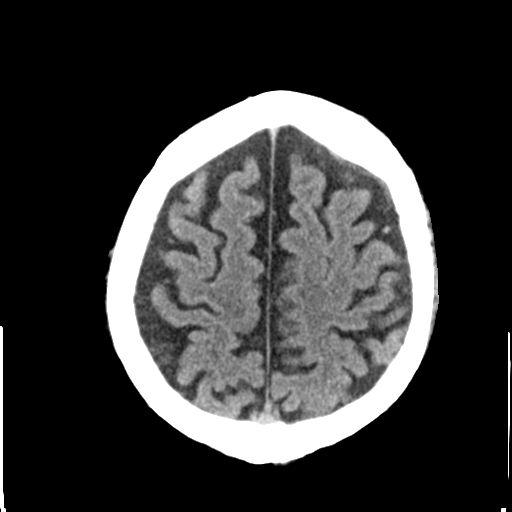
[im 25/32  brain]
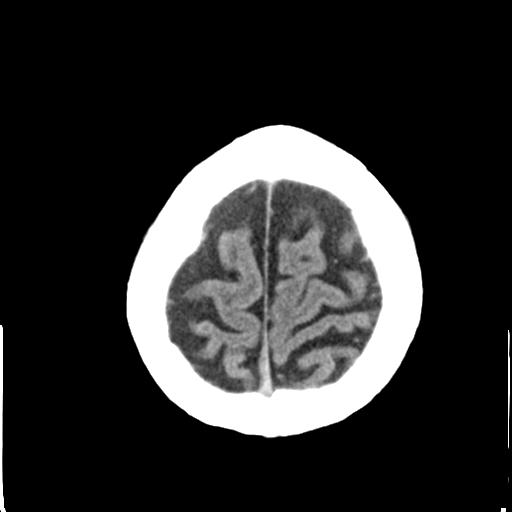
[im 29/32  brain]
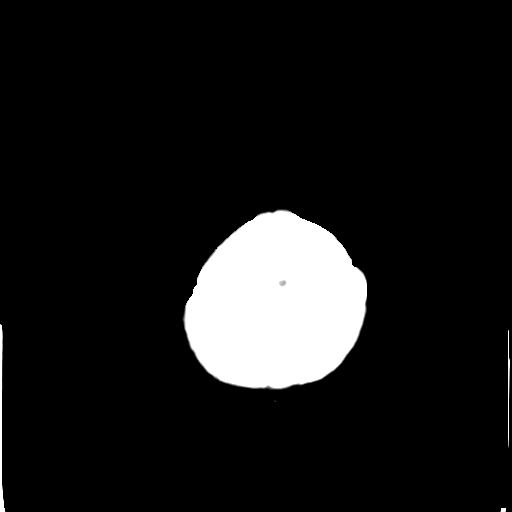

[Series 5: coronal soft tissue · coronal · 0.34mm/px · 3 of 67 slices shown]
[im 23/67  brain]
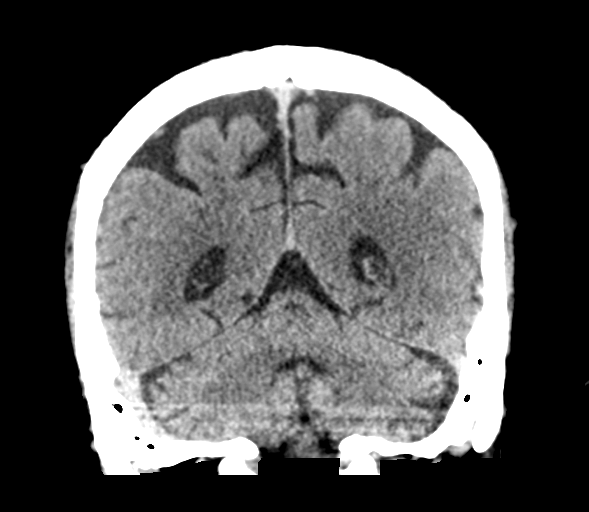
[im 30/67  brain]
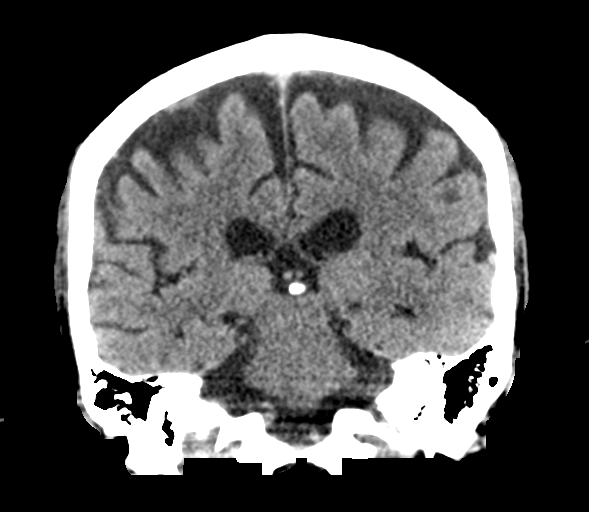
[im 37/67  brain]
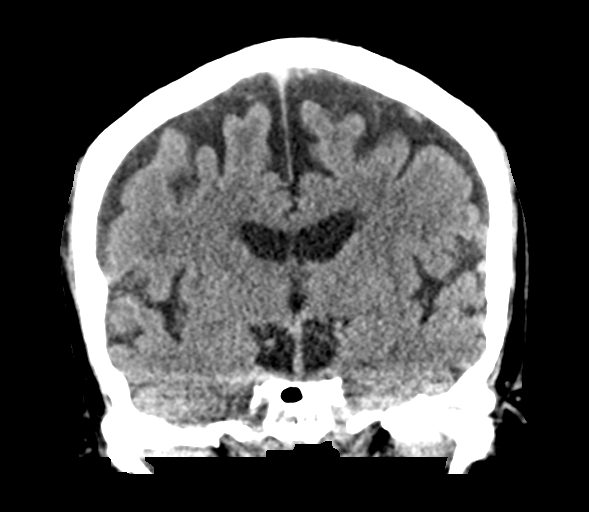

[Series 6: sagittal soft tissue · sagittal · 0.31mm/px · 3 of 61 slices shown]
[im 21/61  brain]
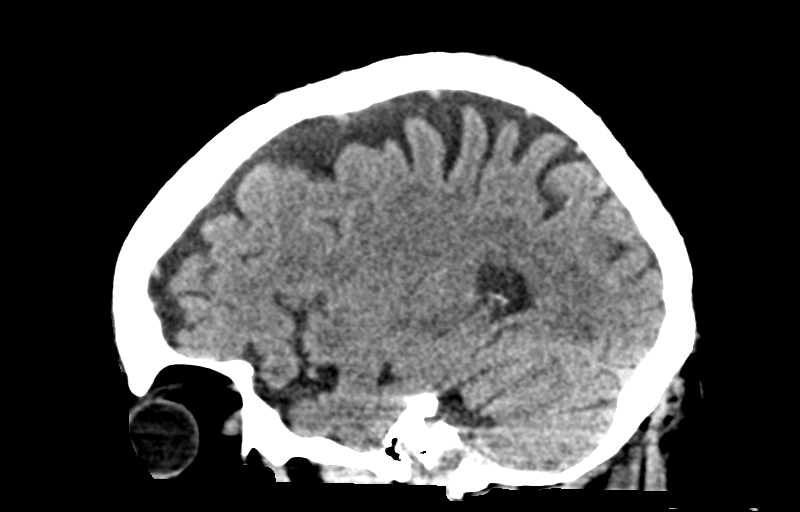
[im 31/61  brain]
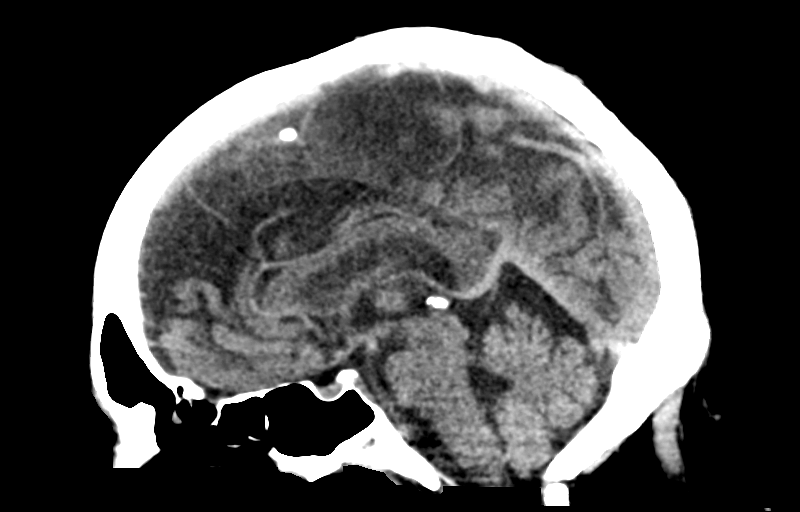
[im 41/61  brain]
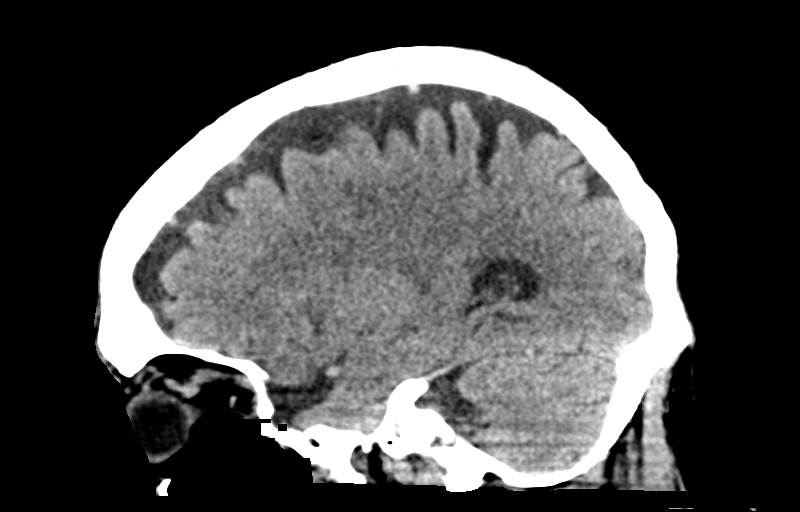

[14 of 47 positions shown; findings below may reference images not displayed]

FINDINGS: Brain: No evidence of acute large vascular territory infarction,
hemorrhage, hydrocephalus, extra-axial collection or mass
lesion/mass effect. Similar remote lacunar infarct in the anterior
aspect of the left internal capsule. Similar additional mild
scattered white matter hypodensities, nonspecific but compatible
with chronic microvascular ischemic disease. Similar moderate
atrophy with ex vacuo ventricular dilation and prominent extra-axial
spaces.

Vascular: No hyperdense vessel identified. Calcific intracranial
atherosclerosis.

Skull: No acute fracture.

Sinuses/Orbits: Clear sinuses.  No acute orbital findings.

ASPECTS ([place] Stroke Program Early CT Score) total score (0-10
with 10 being normal): 10
IMPRESSION: 1. No evidence of acute large vascular territory infarct or acute
hemorrhage. ASPECTS is 10.
2. Remote lacunar infarct in the left external capsule. Mild chronic
microvascular ischemic disease.
3. Moderate atrophy, age advanced.

Code stroke imaging results were communicated on 02/17/2021 at [DATE] to provider Per via telephone, who verbally acknowledged
these results.

## 2022-09-19 DIAGNOSIS — F330 Major depressive disorder, recurrent, mild: Secondary | ICD-10-CM | POA: Diagnosis not present

## 2022-09-19 DIAGNOSIS — F1020 Alcohol dependence, uncomplicated: Secondary | ICD-10-CM | POA: Diagnosis not present

## 2022-09-21 MED FILL — Escitalopram Oxalate Tab 10 MG (Base Equiv): ORAL | 90 days supply | Qty: 90 | Fill #1 | Status: AC

## 2022-09-22 ENCOUNTER — Other Ambulatory Visit: Payer: Self-pay

## 2022-11-07 DIAGNOSIS — F1011 Alcohol abuse, in remission: Secondary | ICD-10-CM | POA: Diagnosis not present

## 2022-11-21 DIAGNOSIS — F1011 Alcohol abuse, in remission: Secondary | ICD-10-CM | POA: Diagnosis not present

## 2022-11-28 DIAGNOSIS — F1011 Alcohol abuse, in remission: Secondary | ICD-10-CM | POA: Diagnosis not present

## 2022-12-05 ENCOUNTER — Other Ambulatory Visit: Payer: Self-pay

## 2022-12-05 ENCOUNTER — Other Ambulatory Visit: Payer: Self-pay | Admitting: Family Medicine

## 2022-12-05 DIAGNOSIS — F1011 Alcohol abuse, in remission: Secondary | ICD-10-CM | POA: Diagnosis not present

## 2022-12-05 DIAGNOSIS — I10 Essential (primary) hypertension: Secondary | ICD-10-CM

## 2022-12-06 ENCOUNTER — Other Ambulatory Visit: Payer: Self-pay

## 2022-12-06 MED FILL — Lisinopril & Hydrochlorothiazide Tab 20-12.5 MG: ORAL | 90 days supply | Qty: 180 | Fill #0 | Status: AC

## 2022-12-07 ENCOUNTER — Other Ambulatory Visit: Payer: Self-pay

## 2022-12-22 DIAGNOSIS — F1011 Alcohol abuse, in remission: Secondary | ICD-10-CM | POA: Diagnosis not present

## 2023-01-02 ENCOUNTER — Encounter: Payer: 59 | Admitting: Family Medicine

## 2023-01-10 ENCOUNTER — Encounter: Payer: Self-pay | Admitting: Family Medicine

## 2023-01-10 ENCOUNTER — Ambulatory Visit (INDEPENDENT_AMBULATORY_CARE_PROVIDER_SITE_OTHER): Payer: 59 | Admitting: Family Medicine

## 2023-01-10 ENCOUNTER — Other Ambulatory Visit: Payer: Self-pay

## 2023-01-10 VITALS — BP 130/78 | HR 80 | Temp 98.4°F | Ht 67.0 in | Wt 222.4 lb

## 2023-01-10 DIAGNOSIS — Z23 Encounter for immunization: Secondary | ICD-10-CM | POA: Diagnosis not present

## 2023-01-10 DIAGNOSIS — Z0000 Encounter for general adult medical examination without abnormal findings: Secondary | ICD-10-CM

## 2023-01-10 DIAGNOSIS — Z0001 Encounter for general adult medical examination with abnormal findings: Secondary | ICD-10-CM

## 2023-01-10 DIAGNOSIS — E782 Mixed hyperlipidemia: Secondary | ICD-10-CM

## 2023-01-10 DIAGNOSIS — N529 Male erectile dysfunction, unspecified: Secondary | ICD-10-CM | POA: Diagnosis not present

## 2023-01-10 DIAGNOSIS — F3341 Major depressive disorder, recurrent, in partial remission: Secondary | ICD-10-CM

## 2023-01-10 DIAGNOSIS — R7303 Prediabetes: Secondary | ICD-10-CM

## 2023-01-10 HISTORY — DX: Encounter for general adult medical examination with abnormal findings: Z00.01

## 2023-01-10 MED ORDER — SILDENAFIL CITRATE 20 MG PO TABS
40.0000 mg | ORAL_TABLET | Freq: Every day | ORAL | 0 refills | Status: DC | PRN
Start: 2023-01-10 — End: 2023-03-28
  Filled 2023-01-10: qty 20, 6d supply, fill #0

## 2023-01-10 NOTE — Assessment & Plan Note (Signed)
Physical exam completed.  Encouraged healthy diet and exercise.  We discussed adding an additional exercise and reducing sugary food intake.  PSA to be completed today for prostate cancer screening.  Tetanus vaccine given today.  Patient declines further COVID vaccinations.  I encouraged him to get the flu vaccine and Shingrix vaccine.  Lab work as outlined.

## 2023-01-10 NOTE — Assessment & Plan Note (Signed)
Suspect vasculogenic erectile dysfunction.  Benign exam today.  Will trial sildenafil 40 to 60 mg once daily as needed for erectile dysfunction.  Advised to seek medical attention if he has a prolonged erection for 4 hours or more.  Advised to stop intercourse if he develops chest pain or excessive shortness of breath.  Advised if he has to call EMS or go to the emergency room he needs to let them know if he has taken this medication within the last 1 to 2 days.

## 2023-01-10 NOTE — Patient Instructions (Signed)
Please let me know if the sildenafil is not effective for your erectile dysfunction.

## 2023-01-10 NOTE — Progress Notes (Signed)
Marikay Alar, MD Phone: [phone]  Stuart Bruce is a 62 y.o. male who presents today for CPE.  Diet: Patient notes he is eating too many carbs.  He has half-and-half in his coffee.  He is drinking less soda.  No sweet tea.  He has candy on a daily basis.  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  No other exercise. Colonoscopy: 2023 7 year recall Prostate cancer screening: due Family history-  Prostate cancer: no  Colon cancer: no Vaccines-   Flu: due  Tetanus: due  Shingles: x2  COVID19: x2 HIV screening: UTD Hep C Screening: UTD Tobacco use: no Alcohol use: no Illicit Drug use: no Dentist: yes Ophthalmology: yes Depression: Patient notes depression is generally stable.  No SI.  He continues on Lexapro.  Erectile dysfunction: Patient notes erectile dysfunction issues recently.  He is not able to get as strong of an erection or an erection that lasts as long.  He is able to ejaculate though it takes much longer.  No associated pain with erections.   Active Ambulatory Problems    Diagnosis Date Noted   History of sleep apnea 04/10/2008   ABNORMAL HEART RHYTHMS 04/10/2008   Lower extremity pain, left 05/19/2015   Benign essential HTN 07/26/2015   Elevated LFTs 02/18/2016   BRBPR (bright red blood per rectum) 07/26/2016   Right shoulder pain 06/13/2019   Macrocytosis 10/08/2019   Obesity (BMI 30.0-34.9) 01/09/2020   Ataxia 06/18/2020   Leg weakness, bilateral 06/18/2020   Thiamine deficiency 09/20/2020   Alcohol abuse 02/18/2021   Major depression 02/18/2021   Dyspnea on exertion 08/16/2021   Atypical chest pain 09/30/2021   Heart murmur 09/30/2021   Biceps tendon tear 11/21/2021   Prediabetes 11/21/2021   Mixed hyperlipidemia 12/09/2021   Full thickness rotator cuff tear 07/23/2019   Leg fatigue 04/03/2022   Encounter for general adult medical examination with abnormal  findings 01/10/2023   Erectile dysfunction 01/10/2023   Resolved Ambulatory Problems    Diagnosis Date Noted   Essential hypertension 03/05/2008   HYPERSOMNIA 03/05/2008   Encounter to establish care 12/18/2014   Loss of weight 07/26/2015   Depression 07/26/2015   Respiratory illness 08/28/2018   Depression, major, single episode, moderate (HCC) 06/13/2019   Chronic alcohol use 06/13/2019   Past Medical History:  Diagnosis Date   Hypertension    OSA (obstructive sleep apnea)    Sleep apnea    Substance abuse (HCC)     Family History  Problem Relation Age of Onset   Heart disease Mother        Valve replacement   Cancer Mother        Breast   Hypertension Mother    Diabetes Father    Cancer Father        Renal   Drug abuse Paternal Aunt    Colon cancer Neg Hx    Colon polyps Neg Hx    Crohn's disease Neg Hx    Esophageal cancer Neg Hx    Rectal cancer Neg Hx    Stomach cancer Neg Hx    Ulcerative colitis Neg Hx     Social History   Socioeconomic History   Marital status: Married    Spouse name: Not on file   Number of children: Not on file   Years of education: Not on file   Highest education level: Not on  file  Occupational History   Not on file  Tobacco Use   Smoking status: Never    Passive exposure: Never   Smokeless tobacco: Never   Tobacco comments:    Never smoked  Vaping Use   Vaping status: Never Used  Substance and Sexual Activity   Alcohol use: Not Currently    Alcohol/week: 6.0 standard drinks of alcohol    Comment: Recovering Alcoholic   Drug use: No   Sexual activity: Yes    Partners: Female    Birth control/protection: Other-see comments    Comment: Partner has been through menopause  Other Topics Concern   Not on file  Social History Narrative   Works for [employer] in [place] with wife and two daughters   Pets: 1 dog lives inside    Caffeine- 2 cups of coffee, no soda/tea    Enjoys playing golf, reading   Right handed    Social Determinants of Health   Financial Resource Strain: Not on file  Food Insecurity: Not on file  Transportation Needs: Not on file  Physical Activity: Not on file  Stress: Not on file  Social Connections: Not on file  Intimate Partner Violence: Not on file    ROS  General:  Negative for nexplained weight loss, fever Skin: Negative for new or changing mole, sore that won't heal HEENT: Negative for trouble hearing, trouble seeing, ringing in ears, mouth sores, hoarseness, change in voice, dysphagia. CV:  Negative for chest pain, dyspnea, edema, palpitations Resp: Negative for cough, dyspnea, hemoptysis GI: Negative for nausea, vomiting, diarrhea, constipation, abdominal pain, melena, hematochezia. GU: Positive for sexual difficulty, negative for dysuria, incontinence, urinary hesitance, hematuria, vaginal or penile discharge, polyuria, lumps in testicle or breasts MSK: Negative for muscle cramps or aches, joint pain or swelling Neuro: Negative for headaches, weakness, numbness, dizziness, passing out/fainting Psych: Positive for depression, negative for anxiety, memory problems  Objective  Physical Exam Vitals:   01/10/23 1320  BP: 124/82  Pulse: 80  Temp: 98.4 F (36.9 C)  SpO2: 98%    BP Readings from Last 3 Encounters:  01/10/23 124/82  07/04/22 115/75  04/03/22 118/78   Wt Readings from Last 3 Encounters:  01/10/23 222 lb 6.4 oz (100.9 kg)  07/04/22 216 lb 6.4 oz (98.2 kg)  04/03/22 216 lb 12.8 oz (98.3 kg)    Physical Exam Constitutional:      General: He is not in acute distress.    Appearance: He is not diaphoretic.  HENT:     Head: Normocephalic and atraumatic.  Cardiovascular:     Rate and Rhythm: Normal rate and regular rhythm.     Heart sounds: Normal heart sounds.  Pulmonary:     Effort: Pulmonary effort is normal.     Breath sounds: Normal breath sounds.  Abdominal:     General: Bowel sounds are normal. There is no distension.      Palpations: Abdomen is soft.     Tenderness: There is no abdominal tenderness.  Genitourinary:    Comments: Normal-appearing circumcised penis Musculoskeletal:     Right lower leg: No edema.     Left lower leg: No edema.  Lymphadenopathy:     Cervical: No cervical adenopathy.  Skin:    General: Skin is warm and dry.  Neurological:     Mental Status: He is alert.  Psychiatric:        Mood and Affect: Mood normal.      Assessment/Plan:   Encounter  for general adult medical examination with abnormal findings Assessment & Plan: Physical exam completed.  Encouraged healthy diet and exercise.  We discussed adding an additional exercise and reducing sugary food intake.  PSA to be completed today for prostate cancer screening.  Tetanus vaccine given today.  Patient declines further COVID vaccinations.  I encouraged him to get the flu vaccine and Shingrix vaccine.  Lab work as outlined.   Recurrent major depressive disorder, in partial remission (HCC) Assessment & Plan: Chronic issue.  Generally stable.  He will continue Lexapro 10 mg daily.   Vasculogenic erectile dysfunction, unspecified vasculogenic erectile dysfunction type Assessment & Plan: Suspect vasculogenic erectile dysfunction.  Benign exam today.  Will trial sildenafil 40 to 60 mg once daily as needed for erectile dysfunction.  Advised to seek medical attention if he has a prolonged erection for 4 hours or more.  Advised to stop intercourse if he develops chest pain or excessive shortness of breath.  Advised if he has to call EMS or go to the emergency room he needs to let them know if he has taken this medication within the last 1 to 2 days.  Orders: -     Sildenafil Citrate; Take 2-3 tablets (40-60 mg total) by mouth daily as needed (ED).  Dispense: 20 tablet; Refill: 0  Mixed hyperlipidemia -     Lipid panel  Prediabetes -     Hemoglobin A1c  Need for Td vaccine -     Td vaccine greater than or equal to 7yo  preservative free IM    Return in about 6 months (around 07/13/2023).   Marikay Alar, MD [name]

## 2023-01-10 NOTE — Assessment & Plan Note (Signed)
Chronic issue.  Generally stable.  He will continue Lexapro 10 mg daily.

## 2023-01-16 ENCOUNTER — Other Ambulatory Visit: Payer: Self-pay

## 2023-01-16 DIAGNOSIS — E782 Mixed hyperlipidemia: Secondary | ICD-10-CM

## 2023-01-16 MED ORDER — ROSUVASTATIN CALCIUM 20 MG PO TABS
20.0000 mg | ORAL_TABLET | Freq: Every day | ORAL | 1 refills | Status: DC
  Filled 2023-01-16: qty 90, 90d supply, fill #0
  Filled 2023-06-11: qty 90, 90d supply, fill #1

## 2023-01-29 ENCOUNTER — Other Ambulatory Visit: Payer: Self-pay | Admitting: Family Medicine

## 2023-01-30 ENCOUNTER — Other Ambulatory Visit: Payer: Self-pay

## 2023-01-30 DIAGNOSIS — F1011 Alcohol abuse, in remission: Secondary | ICD-10-CM | POA: Diagnosis not present

## 2023-01-30 MED ORDER — ESCITALOPRAM OXALATE 10 MG PO TABS
10.0000 mg | ORAL_TABLET | Freq: Every day | ORAL | 1 refills | Status: DC
  Filled 2023-01-30: qty 90, 90d supply, fill #0
  Filled 2023-06-11: qty 90, 90d supply, fill #1

## 2023-02-07 DIAGNOSIS — F1011 Alcohol abuse, in remission: Secondary | ICD-10-CM | POA: Diagnosis not present

## 2023-02-13 ENCOUNTER — Other Ambulatory Visit: Payer: Self-pay

## 2023-02-13 DIAGNOSIS — M7022 Olecranon bursitis, left elbow: Secondary | ICD-10-CM | POA: Diagnosis not present

## 2023-02-13 DIAGNOSIS — M7592 Shoulder lesion, unspecified, left shoulder: Secondary | ICD-10-CM | POA: Diagnosis not present

## 2023-02-13 DIAGNOSIS — M25512 Pain in left shoulder: Secondary | ICD-10-CM | POA: Diagnosis not present

## 2023-02-13 MED ORDER — DICLOFENAC SODIUM 75 MG PO TBEC
75.0000 mg | DELAYED_RELEASE_TABLET | Freq: Two times a day (BID) | ORAL | 0 refills | Status: DC
  Filled 2023-02-13: qty 30, 15d supply, fill #0

## 2023-02-28 ENCOUNTER — Other Ambulatory Visit (INDEPENDENT_AMBULATORY_CARE_PROVIDER_SITE_OTHER): Payer: 59

## 2023-02-28 DIAGNOSIS — E782 Mixed hyperlipidemia: Secondary | ICD-10-CM | POA: Diagnosis not present

## 2023-02-28 LAB — LIPID PANEL
Cholesterol: 145 mg/dL (ref 0–200)
HDL: 32.6 mg/dL — ABNORMAL LOW (ref >39.00)
LDL Cholesterol: 82 mg/dL (ref 0–99)
NonHDL: 112.22
Total CHOL/HDL Ratio: 4
Triglycerides: 149 mg/dL (ref 0.0–149.0)
VLDL: 29.8 mg/dL (ref 0.0–40.0)

## 2023-02-28 LAB — HEPATIC FUNCTION PANEL
ALT: 18 U/L (ref 0–53)
AST: 16 U/L (ref 0–37)
Albumin: 4.2 g/dL (ref 3.5–5.2)
Alkaline Phosphatase: 37 U/L — ABNORMAL LOW (ref 39–117)
Bilirubin, Direct: 0.1 mg/dL (ref 0.0–0.3)
Total Bilirubin: 0.5 mg/dL (ref 0.2–1.2)
Total Protein: 6.8 g/dL (ref 6.0–8.3)

## 2023-03-01 DIAGNOSIS — F1011 Alcohol abuse, in remission: Secondary | ICD-10-CM | POA: Diagnosis not present

## 2023-03-08 ENCOUNTER — Other Ambulatory Visit: Payer: Self-pay

## 2023-03-08 DIAGNOSIS — M7022 Olecranon bursitis, left elbow: Secondary | ICD-10-CM | POA: Diagnosis not present

## 2023-03-08 DIAGNOSIS — M25512 Pain in left shoulder: Secondary | ICD-10-CM | POA: Diagnosis not present

## 2023-03-08 DIAGNOSIS — M7592 Shoulder lesion, unspecified, left shoulder: Secondary | ICD-10-CM | POA: Diagnosis not present

## 2023-03-08 MED ORDER — DICLOFENAC SODIUM 75 MG PO TBEC
75.0000 mg | DELAYED_RELEASE_TABLET | Freq: Two times a day (BID) | ORAL | 0 refills | Status: AC
  Filled 2023-03-08: qty 30, 15d supply, fill #0

## 2023-03-28 ENCOUNTER — Other Ambulatory Visit: Payer: Self-pay | Admitting: Family Medicine

## 2023-03-28 ENCOUNTER — Other Ambulatory Visit: Payer: Self-pay

## 2023-03-28 DIAGNOSIS — N529 Male erectile dysfunction, unspecified: Secondary | ICD-10-CM

## 2023-03-28 MED ORDER — SILDENAFIL CITRATE 20 MG PO TABS
40.0000 mg | ORAL_TABLET | Freq: Every day | ORAL | 0 refills | Status: AC | PRN
  Filled 2023-03-28: qty 20, 30d supply, fill #0

## 2023-04-26 MED FILL — Lisinopril & Hydrochlorothiazide Tab 20-12.5 MG: ORAL | 90 days supply | Qty: 180 | Fill #1 | Status: AC

## 2023-06-11 ENCOUNTER — Other Ambulatory Visit: Payer: Self-pay

## 2023-07-13 ENCOUNTER — Ambulatory Visit: Payer: 59 | Admitting: Family Medicine

## 2023-07-31 ENCOUNTER — Ambulatory Visit: Payer: 59 | Admitting: Internal Medicine

## 2023-08-24 MED FILL — Lisinopril & Hydrochlorothiazide Tab 20-12.5 MG: ORAL | 90 days supply | Qty: 180 | Fill #2 | Status: AC

## 2023-08-30 ENCOUNTER — Ambulatory Visit (INDEPENDENT_AMBULATORY_CARE_PROVIDER_SITE_OTHER): Payer: 59 | Admitting: Nurse Practitioner

## 2023-08-30 ENCOUNTER — Encounter: Payer: Self-pay | Admitting: Nurse Practitioner

## 2023-08-30 VITALS — BP 120/82 | HR 71 | Temp 98.0°F | Ht 67.0 in | Wt 231.4 lb

## 2023-08-30 DIAGNOSIS — Z1329 Encounter for screening for other suspected endocrine disorder: Secondary | ICD-10-CM | POA: Diagnosis not present

## 2023-08-30 DIAGNOSIS — R7303 Prediabetes: Secondary | ICD-10-CM | POA: Diagnosis not present

## 2023-08-30 DIAGNOSIS — E782 Mixed hyperlipidemia: Secondary | ICD-10-CM

## 2023-08-30 DIAGNOSIS — Z8669 Personal history of other diseases of the nervous system and sense organs: Secondary | ICD-10-CM | POA: Diagnosis not present

## 2023-08-30 DIAGNOSIS — F3341 Major depressive disorder, recurrent, in partial remission: Secondary | ICD-10-CM | POA: Diagnosis not present

## 2023-08-30 DIAGNOSIS — R5383 Other fatigue: Secondary | ICD-10-CM

## 2023-08-30 DIAGNOSIS — E669 Obesity, unspecified: Secondary | ICD-10-CM | POA: Insufficient documentation

## 2023-08-30 DIAGNOSIS — I10 Essential (primary) hypertension: Secondary | ICD-10-CM | POA: Diagnosis not present

## 2023-08-30 NOTE — Assessment & Plan Note (Signed)
 Cholesterol is managed with Crestor 20 mg daily. Continue Crestor. We will check lipid panel today. Encourage healthy diet and regular exercise.

## 2023-08-30 NOTE — Assessment & Plan Note (Signed)
 Suspected worsening sleep apnea may be contributing to his fatigue, with possible non-restorative sleep despite sleeping through the night. Order a sleep study to reevaluate sleep apnea and blood work to assess other potential causes of fatigue.

## 2023-08-30 NOTE — Assessment & Plan Note (Signed)
 Mood is well controlled on Lexapro 10 mg daily. Continue. PHQ- 8 today. Encouraged to contact if worsening symptoms, unusual behavior changes or suicidal thoughts occur.

## 2023-08-30 NOTE — Assessment & Plan Note (Addendum)
 Blood pressure is well controlled on Lisinopril-hydrochlorothiazide 20-12.5 mg daily. Continue current medication regimen and monitoring blood pressure at home. We will check lab work as outlined.

## 2023-08-30 NOTE — Assessment & Plan Note (Addendum)
 Suspected worsening sleep apnea may be contributing to his fatigue. See sleep apnea plan. Order a sleep study to reevaluate sleep apnea and blood work to assess other potential causes of fatigue.

## 2023-08-30 NOTE — Progress Notes (Signed)
 Bethanie Dicker, NP-C Phone: [phone]  Stuart Bruce is a 63 y.o. male who presents today for transfer of care.   Discussed the use of AI scribe software for clinical note transcription with the patient, who gave verbal consent to proceed.  History of Present Illness   Stuart Bruce "Stuart Bruce" is a 63 year old male with hypertension and prediabetes who presents for transfer of care with fatigue.  He has been experiencing significant fatigue, particularly in the afternoons, necessitating a nap around 2 or 3 PM. This has been ongoing for the past year. Despite feeling well-rested after nighttime sleep, he initially feels like going back to bed upon waking. No issues with nighttime sleep.  He has a history of sleep apnea but does not currently use a CPAP machine. He underwent a sleep study in the past when he was lighter in weight. He suspects that his sleep apnea may have worsened, contributing to his fatigue.  He acknowledges weight gain, stating that he is at his heaviest. His diet includes a fair amount of fast food, and he has deviated from a previously successful low-carb diet. He recalls losing weight when consuming about 25 grams of carbs daily. 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 He used to run 5Ks about four years ago, prior to gaining approximately 30 pounds. He does not attribute his reduced exercise to fatigue.  He has a history of prediabetes, and it has been at least six months since his last lab work. He is currently on Lexapro 10 mg daily for mood, which he reports as good, and Crestor for cholesterol, which he takes daily. He has been sober for almost a year, which he feels has improved his physical well-being.  No new abdominal pain or muscle aches, although he experienced a one-time cramp-like sensation in his abdomen recently. He notes increased stiffness  when sitting, which resolves upon movement. He monitors his blood pressure at home, which typically ranges from 115/75 to 120/80. No chest pain, shortness of breath, dizziness, or swelling.      Social History   Tobacco Use  Smoking Status Never   Passive exposure: Never  Smokeless Tobacco Never  Tobacco Comments   Never smoked    Current Outpatient Medications on File Prior to Visit  Medication Sig Dispense Refill   Ascorbic Acid (VITAMIN C) 1000 MG tablet Take 1,000 mg by mouth daily.     ASPIRIN LOW DOSE 81 MG chewable tablet Chew 81 mg by mouth daily.     B Complex Vitamins (B COMPLEX 100 PO) Take 1 capsule by mouth daily.     diclofenac (VOLTAREN) 75 MG EC tablet Take 1 tablet by mouth twice daily with food. 30 tablet 0   escitalopram (LEXAPRO) 10 MG tablet TAKE 1 TABLET(10 MG) BY MOUTH DAILY 90 tablet 1   lisinopril-hydrochlorothiazide (ZESTORETIC) 20-12.5 MG tablet Take 2 tablets by mouth daily. 180 tablet 3   rosuvastatin (CRESTOR) 20 MG tablet Take 1 tablet (20 mg total) by mouth daily. 90 tablet 1   sildenafil (REVATIO) 20 MG tablet Take 2-3 tablets (40-60 mg total) by mouth daily as needed (ED). 20 tablet 0   Current Facility-Administered Medications on File Prior to Visit  Medication Dose Route Frequency Provider Last Rate Last Admin   0.9 %  sodium chloride infusion  500 mL Intravenous Once Pyrtle, Carie Caddy, MD  ROS see history of present illness  Objective  Physical Exam Vitals:   08/30/23 1333  BP: 120/82  Pulse: 71  Temp: 98 F (36.7 C)  SpO2: 99%    BP Readings from Last 3 Encounters:  08/30/23 120/82  01/10/23 130/78  07/04/22 115/75   Wt Readings from Last 3 Encounters:  08/30/23 231 lb 6.4 oz (105 kg)  01/10/23 222 lb 6.4 oz (100.9 kg)  07/04/22 216 lb 6.4 oz (98.2 kg)    Physical Exam Constitutional:      General: He is not in acute distress.    Appearance: Normal appearance.  HENT:     Head: Normocephalic.  Cardiovascular:     Rate  and Rhythm: Normal rate and regular rhythm.     Heart sounds: Normal heart sounds.  Pulmonary:     Effort: Pulmonary effort is normal.     Breath sounds: Normal breath sounds.  Skin:    General: Skin is warm and dry.  Neurological:     General: No focal deficit present.     Mental Status: He is alert.  Psychiatric:        Mood and Affect: Mood normal.        Behavior: Behavior normal.     Assessment/Plan: Please see individual problem list.  Other fatigue Assessment & Plan: Suspected worsening sleep apnea may be contributing to his fatigue. See sleep apnea plan. Order a sleep study to reevaluate sleep apnea and blood work to assess other potential causes of fatigue.  Orders: -     Pulmonary Visit -     VITAMIN D 25 Hydroxy (Vit-D Deficiency, Fractures) -     Vitamin B12  History of sleep apnea Assessment & Plan: Suspected worsening sleep apnea may be contributing to his fatigue, with possible non-restorative sleep despite sleeping through the night. Order a sleep study to reevaluate sleep apnea and blood work to assess other potential causes of fatigue.  Orders: -     Pulmonary Visit  Prediabetes Assessment & Plan: Check A1c today to assess current status. Dietary changes are crucial to address his weight and related health issues. Advise reducing fast food intake and returning to a low-carb diet. Encourage regular exercise.   Orders: -     Hemoglobin A1c  Benign essential HTN Assessment & Plan: Blood pressure is well controlled on Lisinopril-hydrochlorothiazide 20-12.5 mg daily. Continue current medication regimen and monitoring blood pressure at home. We will check lab work as outlined.   Orders: -     CBC with Differential/Platelet -     Comprehensive metabolic panel with GFR  Recurrent major depressive disorder, in partial remission (HCC) Assessment & Plan: Mood is well controlled on Lexapro 10 mg daily. Continue. PHQ- 8 today. Encouraged to contact if worsening  symptoms, unusual behavior changes or suicidal thoughts occur.    Mixed hyperlipidemia Assessment & Plan: Cholesterol is managed with Crestor 20 mg daily. Continue Crestor. We will check lipid panel today. Encourage healthy diet and regular exercise.   Orders: -     Lipid panel  Thyroid disorder screen -     TSH    Return in about 6 months (around 03/01/2024) for Follow up.   Bethanie Dicker, NP-C [facility]

## 2023-08-30 NOTE — Assessment & Plan Note (Addendum)
 Check A1c today to assess current status. Dietary changes are crucial to address his weight and related health issues. Advise reducing fast food intake and returning to a low-carb diet. Encourage regular exercise.

## 2023-08-31 LAB — CBC WITH DIFFERENTIAL/PLATELET
Basophils Absolute: 0.1 10*3/uL (ref 0.0–0.1)
Basophils Relative: 0.7 % (ref 0.0–3.0)
Eosinophils Absolute: 0.3 10*3/uL (ref 0.0–0.7)
Eosinophils Relative: 3.9 % (ref 0.0–5.0)
HCT: 43.1 % (ref 39.0–52.0)
Hemoglobin: 14.6 g/dL (ref 13.0–17.0)
Lymphocytes Relative: 20.2 % (ref 12.0–46.0)
Lymphs Abs: 1.6 10*3/uL (ref 0.7–4.0)
MCHC: 33.9 g/dL (ref 30.0–36.0)
MCV: 91.7 fl (ref 78.0–100.0)
Monocytes Absolute: 0.9 10*3/uL (ref 0.1–1.0)
Monocytes Relative: 11.5 % (ref 3.0–12.0)
Neutro Abs: 5 10*3/uL (ref 1.4–7.7)
Neutrophils Relative %: 63.7 % (ref 43.0–77.0)
Platelets: 263 10*3/uL (ref 150.0–400.0)
RBC: 4.7 Mil/uL (ref 4.22–5.81)
RDW: 13.8 % (ref 11.5–15.5)
WBC: 7.9 10*3/uL (ref 4.0–10.5)

## 2023-08-31 LAB — VITAMIN B12: Vitamin B-12: 419 pg/mL (ref 211–911)

## 2023-08-31 LAB — LIPID PANEL
Cholesterol: 128 mg/dL (ref 0–200)
HDL: 39.9 mg/dL (ref >39.00)
LDL Cholesterol: 63 mg/dL (ref 0–99)
NonHDL: 88.39
Total CHOL/HDL Ratio: 3
Triglycerides: 127 mg/dL (ref 0.0–149.0)
VLDL: 25.4 mg/dL (ref 0.0–40.0)

## 2023-08-31 LAB — COMPREHENSIVE METABOLIC PANEL WITH GFR
ALT: 18 U/L (ref 0–53)
AST: 15 U/L (ref 0–37)
Albumin: 4.7 g/dL (ref 3.5–5.2)
Alkaline Phosphatase: 41 U/L (ref 39–117)
BUN: 12 mg/dL (ref 6–23)
CO2: 31 meq/L (ref 19–32)
Calcium: 9.6 mg/dL (ref 8.4–10.5)
Chloride: 97 meq/L (ref 96–112)
Creatinine, Ser: 0.78 mg/dL (ref 0.40–1.50)
GFR: 97.03 mL/min (ref >60.00)
Glucose, Bld: 105 mg/dL — ABNORMAL HIGH (ref 70–99)
Potassium: 4.2 meq/L (ref 3.5–5.1)
Sodium: 138 meq/L (ref 135–145)
Total Bilirubin: 0.5 mg/dL (ref 0.2–1.2)
Total Protein: 7 g/dL (ref 6.0–8.3)

## 2023-08-31 LAB — VITAMIN D 25 HYDROXY (VIT D DEFICIENCY, FRACTURES): VITD: 15.74 ng/mL — ABNORMAL LOW (ref 30.00–100.00)

## 2023-08-31 LAB — TSH: TSH: 0.84 u[IU]/mL (ref 0.35–5.50)

## 2023-09-01 LAB — HEMOGLOBIN A1C: Hgb A1c MFr Bld: 6.5 % (ref 4.6–6.5)

## 2023-09-05 ENCOUNTER — Other Ambulatory Visit: Payer: Self-pay

## 2023-09-05 ENCOUNTER — Encounter: Payer: Self-pay | Admitting: Nurse Practitioner

## 2023-09-05 ENCOUNTER — Other Ambulatory Visit: Payer: Self-pay | Admitting: Nurse Practitioner

## 2023-09-05 DIAGNOSIS — E559 Vitamin D deficiency, unspecified: Secondary | ICD-10-CM

## 2023-09-05 MED ORDER — VITAMIN D (ERGOCALCIFEROL) 1.25 MG (50000 UNIT) PO CAPS
50000.0000 [IU] | ORAL_CAPSULE | ORAL | 1 refills | Status: DC
  Filled 2023-09-05: qty 12, 84d supply, fill #0
  Filled 2023-11-22: qty 12, 84d supply, fill #1
  Filled 2024-03-08: qty 12, 84d supply, fill #2

## 2023-09-06 ENCOUNTER — Ambulatory Visit: Admitting: Sleep Medicine

## 2023-09-06 ENCOUNTER — Encounter: Payer: Self-pay | Admitting: Sleep Medicine

## 2023-09-06 VITALS — BP 116/62 | HR 56 | Temp 96.9°F | Ht 67.0 in | Wt 227.8 lb

## 2023-09-06 DIAGNOSIS — G4733 Obstructive sleep apnea (adult) (pediatric): Secondary | ICD-10-CM | POA: Diagnosis not present

## 2023-09-06 DIAGNOSIS — E669 Obesity, unspecified: Secondary | ICD-10-CM | POA: Diagnosis not present

## 2023-09-06 DIAGNOSIS — I10 Essential (primary) hypertension: Secondary | ICD-10-CM | POA: Diagnosis not present

## 2023-09-06 DIAGNOSIS — E66812 Obesity, class 2: Secondary | ICD-10-CM

## 2023-09-06 NOTE — Progress Notes (Signed)
 Name:Stuart Bruce MRN: [id number] DOB: 10/31/1960   CHIEF COMPLAINT:  REASSESSMENT OF OSA   HISTORY OF PRESENT ILLNESS:  Stuart Bruce is a 63 y.o. w/ a h/o OSA, HTN, anxiety, depression, hyperlipidemia and obesity who presents for reassessment of OSA. Reports that he was initially diagnosed with moderate OSA in 2009 and did not proceed with CPAP therapy. Reports weight fluctuations throughout the day. Reports c/o loud snoring, witnessed apnea and excessive daytime sleepiness. Reports nocturnal awakenings due to unclear reasons, however does not have difficulty falling back to sleep. Denies morning headaches, RLS symptoms, dream enactment, cataplexy, hypnagogic or hypnapompic hallucinations. Denies a family history of sleep apnea. Denies drowsy driving. Drinks 1 pot of coffee daily, denies alcohol, tobacco or illicit drug use.   Bedtime 11 pm Sleep onset 5 mins Rise time 7 am   EPWORTH SLEEP SCORE 14     No data to display          PAST MEDICAL HISTORY :   has a past medical history of Depression, Encounter for general adult medical examination with abnormal findings (01/10/2023), Hypertension, OSA (obstructive sleep apnea), Sleep apnea, and Substance abuse (HCC).  has a past surgical history that includes Hand surgery (Right, 06/06/2003); Rotator cuff repair (Right); Colonoscopy; Polypectomy; and Joint replacement (07/2018). Prior to Admission medications   Medication Sig Start Date End Date Taking? Authorizing Provider  Ascorbic Acid (VITAMIN C) 1000 MG tablet Take 1,000 mg by mouth daily.    [provider]  ASPIRIN LOW DOSE 81 MG chewable tablet Chew 81 mg by mouth daily. 01/24/21   [provider]  B Complex Vitamins (B COMPLEX 100 PO) Take 1 capsule by mouth daily.    [provider]  diclofenac (VOLTAREN) 75 MG EC tablet Take 1 tablet by mouth twice daily with food. 03/08/23     escitalopram (LEXAPRO) 10 MG tablet TAKE 1 TABLET(10 MG) BY MOUTH DAILY  01/30/23   Glori Luis, MD  lisinopril-hydrochlorothiazide (ZESTORETIC) 20-12.5 MG tablet Take 2 tablets by mouth daily. 12/06/22   Glori Luis, MD  rosuvastatin (CRESTOR) 20 MG tablet Take 1 tablet (20 mg total) by mouth daily. 01/16/23   Glori Luis, MD  sildenafil (REVATIO) 20 MG tablet Take 2-3 tablets (40-60 mg total) by mouth daily as needed (ED). 03/28/23   Glori Luis, MD  Vitamin D, Ergocalciferol, (DRISDOL) 1.25 MG (50000 UNIT) CAPS capsule Take 1 capsule (50,000 Units total) by mouth every 7 (seven) days. 09/05/23   Bethanie Dicker, NP   No Known Allergies  FAMILY HISTORY:  family history includes Cancer in his father and mother; Diabetes in his father; Drug abuse in his paternal aunt; Heart disease in his mother; Hypertension in his mother. SOCIAL HISTORY:  reports that he has never smoked. He has never been exposed to tobacco smoke. He has never used smokeless tobacco. He reports that he does not currently use alcohol after a past usage of about 6.0 standard drinks of alcohol per week. He reports that he does not use drugs.   Review of Systems:  Gen:  Denies  fever, sweats, chills weight loss  HEENT: Denies blurred vision, double vision, ear pain, eye pain, hearing loss, nose bleeds, sore throat Cardiac:  No dizziness, chest pain or heaviness, chest tightness,edema, No JVD Resp:   No cough, -sputum production, -shortness of breath,-wheezing, -hemoptysis,  Gi: Denies swallowing difficulty, stomach pain, nausea or vomiting, diarrhea, constipation, bowel incontinence Gu:  Denies bladder  incontinence, burning urine Ext:   Denies Joint pain, stiffness or swelling Skin: Denies  skin rash, easy bruising or bleeding or hives Endoc:  Denies polyuria, polydipsia , polyphagia or weight change Psych:   Denies depression, insomnia or hallucinations  Other:  All other systems negative  VITAL SIGNS: BP 116/62 (BP Location: Right Arm, Cuff Size: Large)   Pulse (!) 56    Temp (!) 96.9 F (36.1 C)   Ht 5\' 7"  (1.702 m)   Wt 227 lb 12.8 oz (103.3 kg)   SpO2 97%   BMI 35.68 kg/m     Physical Examination:   General Appearance: No distress  EYES PERRLA, EOM intact.   NECK Supple, No JVD Pulmonary: normal breath sounds, No wheezing.  CardiovascularNormal S1,S2.  No m/r/g.   Abdomen: Benign, Soft, non-tender. Skin:   warm, no rashes, no ecchymosis  Extremities: normal, no cyanosis, clubbing. Neuro:without focal findings,  speech normal  PSYCHIATRIC: Mood, affect within normal limits.   ASSESSMENT AND PLAN  OSA I suspect that OSA is likely worsened due to clinical presentation. Discussed the consequences of untreated sleep apnea. Advised not to drive drowsy for safety of patient and others. Will complete further evaluation with a home sleep study and follow up to review results.    HTN Stable, on current management. Following with PCP.   Obesity Counseled patient on diet and lifestyle modification.    MEDICATION ADJUSTMENTS/LABS AND TESTS ORDERED: Recommend Sleep Study   Patient  satisfied with Plan of action and management. All questions answered  Follow up to review HST results and treatment plan.   I spent a total of 48 minutes reviewing chart data, face-to-face evaluation with the patient, counseling and coordination of care as detailed above.    Tempie Hoist, M.D.  Sleep Medicine  Pulmonary & Critical Care Medicine

## 2023-09-06 NOTE — Patient Instructions (Signed)
 Marland Kitchen

## 2023-09-14 ENCOUNTER — Encounter

## 2023-09-14 DIAGNOSIS — F1011 Alcohol abuse, in remission: Secondary | ICD-10-CM | POA: Diagnosis not present

## 2023-09-14 DIAGNOSIS — G4733 Obstructive sleep apnea (adult) (pediatric): Secondary | ICD-10-CM

## 2023-09-26 DIAGNOSIS — R069 Unspecified abnormalities of breathing: Secondary | ICD-10-CM | POA: Diagnosis not present

## 2023-09-27 ENCOUNTER — Other Ambulatory Visit: Payer: Self-pay

## 2023-09-27 DIAGNOSIS — G4733 Obstructive sleep apnea (adult) (pediatric): Secondary | ICD-10-CM

## 2023-10-01 ENCOUNTER — Other Ambulatory Visit: Payer: Self-pay | Admitting: Nurse Practitioner

## 2023-10-01 ENCOUNTER — Other Ambulatory Visit: Payer: Self-pay

## 2023-10-01 MED FILL — Rosuvastatin Calcium Tab 20 MG: ORAL | 90 days supply | Qty: 90 | Fill #0 | Status: AC

## 2023-10-02 ENCOUNTER — Other Ambulatory Visit: Payer: Self-pay

## 2023-10-04 ENCOUNTER — Telehealth: Payer: Self-pay | Admitting: Nurse Practitioner

## 2023-10-04 ENCOUNTER — Other Ambulatory Visit: Payer: Self-pay

## 2023-10-04 MED FILL — Escitalopram Oxalate Tab 10 MG (Base Equiv): ORAL | 90 days supply | Qty: 90 | Fill #0 | Status: AC

## 2023-10-08 ENCOUNTER — Ambulatory Visit: Admitting: Sleep Medicine

## 2023-10-15 DIAGNOSIS — G4733 Obstructive sleep apnea (adult) (pediatric): Secondary | ICD-10-CM | POA: Diagnosis not present

## 2023-10-18 NOTE — Telephone Encounter (Signed)
 Contacted pt via Mychart to share suggestions from Dr. Kieran Pellet.

## 2023-11-15 DIAGNOSIS — G4733 Obstructive sleep apnea (adult) (pediatric): Secondary | ICD-10-CM | POA: Diagnosis not present

## 2023-11-30 ENCOUNTER — Encounter: Payer: Self-pay | Admitting: Internal Medicine

## 2023-11-30 ENCOUNTER — Ambulatory Visit: Attending: Internal Medicine | Admitting: Internal Medicine

## 2023-11-30 VITALS — BP 100/60 | HR 47 | Ht 67.0 in | Wt 234.0 lb

## 2023-11-30 DIAGNOSIS — R001 Bradycardia, unspecified: Secondary | ICD-10-CM

## 2023-11-30 DIAGNOSIS — E782 Mixed hyperlipidemia: Secondary | ICD-10-CM | POA: Diagnosis not present

## 2023-11-30 DIAGNOSIS — I10 Essential (primary) hypertension: Secondary | ICD-10-CM

## 2023-11-30 DIAGNOSIS — R0609 Other forms of dyspnea: Secondary | ICD-10-CM

## 2023-11-30 NOTE — Patient Instructions (Signed)

## 2023-11-30 NOTE — Progress Notes (Unsigned)
 Cardiology Office Note:  .   Date:  12/01/2023  ID:  Stuart Bruce, DOB 1960/11/21, MRN [id number] PCP: Gretel App, NP  [facility] Providers Cardiologist:  Lonni Hanson, MD     History of Present Illness: .   Stuart Bruce is a 63 y.o. male with history of hypertension, obstructive sleep apnea, depression, and prior alcohol  use, who presents for follow-up of shortness of breath and hypertension.  I last saw him in 12/2021, which time he was feeling well without any further episodes of shortness of breath.  Preceding echo was notable for normal LVEF with mild intracavitary gradient.  Exercise tolerance test was low risk.  Today, Mr. Chelf reports that he has been feeling well.  His main concern is his difficulty losing weight.  He denies chest pain, shortness of breath, palpitations, lightheadedness, and edema.  Home BP are usually around 11/70.  He began using CPAP about a month ago and feels like his drowsiness is getting better.  ROS: See HPI  Studies Reviewed: SABRA   EKG Interpretation Date/Time:  Friday November 30 2023 08:45:47 EDT Ventricular Rate:  47 PR Interval:  152 QRS Duration:  102 QT Interval:  452 QTC Calculation: 400 R Axis:   -29  Text Interpretation: Sinus bradycardia When compared with ECG of 17-Feb-2021 17:25, HEART RATE has decreased Otherwise no significant change Confirmed by Cray Monnin, Lonni [phone]) on 12/01/2023 5:25:48 PM    Exercise tolerance test (10/17/2021): Low risk study without evidence of inducible ischemia.  Sensitivity and specificity reduced by motion artifact during stress and early recovery.  Hypertensive blood pressure response noted.  Transthoracic echocardiogram (10/04/2021): Normal LV size with mild LVH.  LVEF 60-65% with grade 1 diastolic dysfunction.  Normal wall motion.  Normal RV size and function.  Normal biatrial size.  Moderate mitral annular calcification without regurgitation or stenosis noted.  Normal aortic, tricuspid, and pulmonic  valves.  Borderline enlargement of aortic root (3.7 cm).  Normal CVP.  Risk Assessment/Calculations:             Physical Exam:   VS:  BP 100/60 (BP Location: Left Arm, Patient Position: Sitting, Cuff Size: Large)   Pulse (!) 47   Ht 5' 7 (1.702 m)   Wt 234 lb (106.1 kg)   SpO2 99%   BMI 36.65 kg/m    Wt Readings from Last 3 Encounters:  11/30/23 234 lb (106.1 kg)  09/06/23 227 lb 12.8 oz (103.3 kg)  08/30/23 231 lb 6.4 oz (105 kg)   Ambulating HR: 53->72 bpm  General:  NAD. Neck: No JVD or HJR. Lungs: Clear to auscultation bilaterally without wheezes or crackles. Heart: Regular rate and rhythm without murmurs, rubs, or gallops. Abdomen: Soft, nontender, nondistended. Extremities: No lower extremity edema.  ASSESSMENT AND PLAN: .    Sinus bradycardia: Moderate sinus bradycardia noted on exam today, albeit asymptomatic.  Mr. Lepera was able to ambulate in the office without difficulty and demonstrated a normal chronotropic response.  He is not on any rate-lowering agents.  TSH in 08/2023 was normal at 0.84.  We will continue to avoid rate-lowering agents like beta-blockers, calcium  channel blockers, and clonidine.  No further workup recommended at this time.  Hypertension: BP borderline low today in the office though typically a little higher (but still within target range) at home.  Query if initiation of CPAP has helped improve BP.  We will continue current dose of lisinopril -hydrochlorothiazide , though deescalation will need to be considered if BP continues to  run low.  Hyperlipidemia: Lipids well-controlled on last check in 08/2023.  Continue rosuvastatin  20 mg daily for primary prevention under the direction of Ms. Gretel.    Dispo: Return to clinic in 1 year.  Signed, Lonni Hanson, MD

## 2023-12-01 ENCOUNTER — Encounter: Payer: Self-pay | Admitting: Internal Medicine

## 2023-12-05 MED FILL — Lisinopril & Hydrochlorothiazide Tab 20-12.5 MG: ORAL | 90 days supply | Qty: 180 | Fill #3 | Status: AC

## 2023-12-15 DIAGNOSIS — G4733 Obstructive sleep apnea (adult) (pediatric): Secondary | ICD-10-CM | POA: Diagnosis not present

## 2024-01-13 ENCOUNTER — Other Ambulatory Visit: Payer: Self-pay | Admitting: Nurse Practitioner

## 2024-01-13 MED FILL — Rosuvastatin Calcium Tab 20 MG: ORAL | 90 days supply | Qty: 90 | Fill #1 | Status: AC

## 2024-01-14 ENCOUNTER — Other Ambulatory Visit: Payer: Self-pay

## 2024-01-14 MED ORDER — ESCITALOPRAM OXALATE 10 MG PO TABS
10.0000 mg | ORAL_TABLET | Freq: Every day | ORAL | 0 refills | Status: DC
  Filled 2024-01-14: qty 90, 90d supply, fill #0

## 2024-01-15 DIAGNOSIS — G4733 Obstructive sleep apnea (adult) (pediatric): Secondary | ICD-10-CM | POA: Diagnosis not present

## 2024-01-16 DIAGNOSIS — G4733 Obstructive sleep apnea (adult) (pediatric): Secondary | ICD-10-CM | POA: Diagnosis not present

## 2024-02-15 DIAGNOSIS — G4733 Obstructive sleep apnea (adult) (pediatric): Secondary | ICD-10-CM | POA: Diagnosis not present

## 2024-03-04 ENCOUNTER — Ambulatory Visit: Admitting: Nurse Practitioner

## 2024-03-04 ENCOUNTER — Encounter: Payer: Self-pay | Admitting: Nurse Practitioner

## 2024-03-04 VITALS — BP 118/80 | HR 53 | Temp 98.5°F | Ht 67.0 in | Wt 233.4 lb

## 2024-03-04 DIAGNOSIS — E669 Obesity, unspecified: Secondary | ICD-10-CM

## 2024-03-04 DIAGNOSIS — R7303 Prediabetes: Secondary | ICD-10-CM | POA: Diagnosis not present

## 2024-03-04 DIAGNOSIS — I10 Essential (primary) hypertension: Secondary | ICD-10-CM | POA: Diagnosis not present

## 2024-03-04 DIAGNOSIS — E559 Vitamin D deficiency, unspecified: Secondary | ICD-10-CM

## 2024-03-04 DIAGNOSIS — E782 Mixed hyperlipidemia: Secondary | ICD-10-CM

## 2024-03-04 DIAGNOSIS — Z23 Encounter for immunization: Secondary | ICD-10-CM | POA: Diagnosis not present

## 2024-03-04 DIAGNOSIS — N529 Male erectile dysfunction, unspecified: Secondary | ICD-10-CM | POA: Diagnosis not present

## 2024-03-04 DIAGNOSIS — F3341 Major depressive disorder, recurrent, in partial remission: Secondary | ICD-10-CM

## 2024-03-04 LAB — COMPREHENSIVE METABOLIC PANEL WITH GFR
ALT: 16 U/L (ref 0–53)
AST: 15 U/L (ref 0–37)
Albumin: 4.5 g/dL (ref 3.5–5.2)
Alkaline Phosphatase: 37 U/L — ABNORMAL LOW (ref 39–117)
BUN: 10 mg/dL (ref 6–23)
CO2: 30 meq/L (ref 19–32)
Calcium: 9.6 mg/dL (ref 8.4–10.5)
Chloride: 99 meq/L (ref 96–112)
Creatinine, Ser: 0.85 mg/dL (ref 0.40–1.50)
GFR: 94.2 mL/min (ref >60.00)
Glucose, Bld: 131 mg/dL — ABNORMAL HIGH (ref 70–99)
Potassium: 4.3 meq/L (ref 3.5–5.1)
Sodium: 136 meq/L (ref 135–145)
Total Bilirubin: 0.5 mg/dL (ref 0.2–1.2)
Total Protein: 6.7 g/dL (ref 6.0–8.3)

## 2024-03-04 LAB — TESTOSTERONE: Testosterone: 216.15 ng/dL — ABNORMAL LOW (ref 300.00–890.00)

## 2024-03-04 LAB — HEMOGLOBIN A1C: Hgb A1c MFr Bld: 6.6 % — ABNORMAL HIGH (ref 4.6–6.5)

## 2024-03-04 LAB — VITAMIN D 25 HYDROXY (VIT D DEFICIENCY, FRACTURES): VITD: 32.32 ng/mL (ref 30.00–100.00)

## 2024-03-04 NOTE — Assessment & Plan Note (Signed)
 Encourage healthy diet and regular exercise.

## 2024-03-04 NOTE — Assessment & Plan Note (Signed)
 Essential hypertension is well-controlled with lisinopril  and hydrochlorothiazide . Continue current antihypertensive regimen. Check CMP today. Encourage continued home monitoring.

## 2024-03-04 NOTE — Progress Notes (Signed)
 Leron Glance, NP-C Phone: [phone]  Stuart Bruce is a 63 y.o. male who presents today for follow up.   Discussed the use of AI scribe software for clinical note transcription with the patient, who gave verbal consent to proceed.  History of Present Illness   Stuart Bruce is a 63 year old male who presents for a six-month follow-up.  He has been stable on Lexapro  for depression, maintaining a stable mood. He is concerned about its long-term use, particularly its impact on libido, which is causing issues at home. He is considering weaning off Lexapro  as he feels his mood is stable, possibly due to his sobriety of 483 days. He has been taking sildenafil , but it has not been effective recently, even at a dose of three tablets.  He has a history of prediabetes with a previous A1c of 6.5. He acknowledges that his diet is poor and has not put in the effort he has in the past. 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 He is exploring dietary options that suit both him and his wife, who have different food preferences.  He is currently taking Crestor  for cholesterol and lisinopril  with hydrochlorothiazide  for blood pressure management. He monitors his blood pressure at home, which typically ranges from 110/70 to 118/80. He denies chest pain, shortness of breath, dizziness, or swelling.  He is taking a once-weekly vitamin D  supplement and has been referred to a sleep doctor, which he reports has been beneficial.      Social History   Tobacco Use  Smoking Status Never   Passive exposure: Never  Smokeless Tobacco Never  Tobacco Comments   Never smoked    Current Outpatient Medications on File Prior to Visit  Medication Sig Dispense Refill   Ascorbic Acid (VITAMIN C) 1000 MG tablet Take 1,000 mg by mouth daily.     ASPIRIN LOW DOSE  81 MG chewable tablet Chew 81 mg by mouth daily.     B Complex Vitamins (B COMPLEX 100 PO) Take 1 capsule by mouth daily.     diclofenac  (VOLTAREN ) 75 MG EC tablet Take 1 tablet by mouth twice daily with food. 30 tablet 0   escitalopram  (LEXAPRO ) 10 MG tablet Take 1 tablet (10 mg total) by mouth daily. 90 tablet 0   lisinopril -hydrochlorothiazide  (ZESTORETIC ) 20-12.5 MG tablet Take 2 tablets by mouth daily. 180 tablet 3   rosuvastatin  (CRESTOR ) 20 MG tablet Take 1 tablet (20 mg total) by mouth daily. 90 tablet 1   sildenafil  (REVATIO ) 20 MG tablet Take 2-3 tablets (40-60 mg total) by mouth daily as needed (ED). 20 tablet 0   Vitamin D , Ergocalciferol , (DRISDOL ) 1.25 MG (50000 UNIT) CAPS capsule Take 1 capsule (50,000 Units total) by mouth every 7 (seven) days. 13 capsule 1   Current Facility-Administered Medications on File Prior to Visit  Medication Dose Route Frequency Provider Last Rate Last Admin   0.9 %  sodium chloride  infusion  500 mL Intravenous Once Pyrtle, Gordy HERO, MD         ROS see history of present illness  Objective  Physical Exam Vitals:   03/04/24 0827  BP: 118/80  Pulse: (!) 53  Temp: 98.5 F (36.9 C)  SpO2: 96%    BP Readings from Last 3 Encounters:  03/04/24 118/80  11/30/23 100/60  09/06/23  116/62   Wt Readings from Last 3 Encounters:  03/04/24 233 lb 6.4 oz (105.9 kg)  11/30/23 234 lb (106.1 kg)  09/06/23 227 lb 12.8 oz (103.3 kg)    Physical Exam Constitutional:      General: He is not in acute distress.    Appearance: Normal appearance. He is obese.  HENT:     Head: Normocephalic.  Cardiovascular:     Rate and Rhythm: Normal rate and regular rhythm.     Heart sounds: Normal heart sounds.  Pulmonary:     Effort: Pulmonary effort is normal.     Breath sounds: Normal breath sounds.  Skin:    General: Skin is warm and dry.  Neurological:     General: No focal deficit present.     Mental Status: He is alert.  Psychiatric:        Mood and  Affect: Mood normal.        Behavior: Behavior normal.      Assessment/Plan: Please see individual problem list.  Recurrent major depressive disorder, in partial remission Assessment & Plan: Major depressive disorder is well-managed with Lexapro . He is interested in weaning off due to side effects, particularly libido issues. Wean off Lexapro  by decreasing to 5 mg for two weeks, then stop. Schedule follow-up in four weeks to monitor mood and reassess the need for Lexapro .   Vasculogenic erectile dysfunction, unspecified vasculogenic erectile dysfunction type Assessment & Plan: Erectile dysfunction is not well managed with sildenafil . Lexapro , antihypertensive use and low testosterone may contribute. Wean off Lexapro  by decreasing to 5 mg for two weeks, then stop. Check testosterone levels today. Follow up in 4 weeks to reassess.  Orders: -     Testosterone  Prediabetes Assessment & Plan: Prediabetes with previous A1c of 6.5. Emphasize dietary changes to manage the condition. Check A1c levels. Emphasize the importance of dietary changes and encourage continued physical activity. Consider starting medication if A1c has increased.  Orders: -     Hemoglobin A1c  Obesity (BMI 30-39.9) Assessment & Plan: Encourage healthy diet and regular exercise.    Vitamin D  deficiency Assessment & Plan: Previously started on once weekly high dose supplementation. Check vitamin D  level today. Continue supplementation, consider switch to OTC daily dosing if levels stable.   Orders: -     VITAMIN D  25 Hydroxy (Vit-D Deficiency, Fractures)  Benign essential HTN Assessment & Plan: Essential hypertension is well-controlled with lisinopril  and hydrochlorothiazide . Continue current antihypertensive regimen. Check CMP today. Encourage continued home monitoring.   Orders: -     Comprehensive metabolic panel with GFR  Mixed hyperlipidemia Assessment & Plan: Mixed hyperlipidemia is managed with  Crestor . Continue. Encourage healthy diet and regular exercise.    Need for influenza vaccination -     Flu vaccine trivalent PF, 6mos and older(Flulaval,Afluria,Fluarix,Fluzone)     Return in about 4 weeks (around 04/01/2024) for Anxiety/Depression.   Leron Glance, NP-C [facility]

## 2024-03-04 NOTE — Assessment & Plan Note (Signed)
 Erectile dysfunction is not well managed with sildenafil . Lexapro , antihypertensive use and low testosterone may contribute. Wean off Lexapro  by decreasing to 5 mg for two weeks, then stop. Check testosterone levels today. Follow up in 4 weeks to reassess.

## 2024-03-04 NOTE — Assessment & Plan Note (Signed)
 Mixed hyperlipidemia is managed with Crestor . Continue. Encourage healthy diet and regular exercise.

## 2024-03-04 NOTE — Assessment & Plan Note (Signed)
 Prediabetes with previous A1c of 6.5. Emphasize dietary changes to manage the condition. Check A1c levels. Emphasize the importance of dietary changes and encourage continued physical activity. Consider starting medication if A1c has increased.

## 2024-03-04 NOTE — Assessment & Plan Note (Signed)
 Major depressive disorder is well-managed with Lexapro . He is interested in weaning off due to side effects, particularly libido issues. Wean off Lexapro  by decreasing to 5 mg for two weeks, then stop. Schedule follow-up in four weeks to monitor mood and reassess the need for Lexapro .

## 2024-03-04 NOTE — Assessment & Plan Note (Signed)
 Previously started on once weekly high dose supplementation. Check vitamin D  level today. Continue supplementation, consider switch to OTC daily dosing if levels stable.

## 2024-03-06 ENCOUNTER — Ambulatory Visit: Payer: Self-pay | Admitting: Nurse Practitioner

## 2024-03-06 ENCOUNTER — Other Ambulatory Visit: Payer: Self-pay | Admitting: Nurse Practitioner

## 2024-03-06 DIAGNOSIS — N529 Male erectile dysfunction, unspecified: Secondary | ICD-10-CM

## 2024-03-06 DIAGNOSIS — R7989 Other specified abnormal findings of blood chemistry: Secondary | ICD-10-CM

## 2024-03-08 ENCOUNTER — Other Ambulatory Visit: Payer: Self-pay

## 2024-03-08 ENCOUNTER — Other Ambulatory Visit: Payer: Self-pay | Admitting: Nurse Practitioner

## 2024-03-08 DIAGNOSIS — E559 Vitamin D deficiency, unspecified: Secondary | ICD-10-CM

## 2024-03-10 ENCOUNTER — Other Ambulatory Visit: Payer: Self-pay | Admitting: Nurse Practitioner

## 2024-03-10 ENCOUNTER — Other Ambulatory Visit: Payer: Self-pay

## 2024-03-10 DIAGNOSIS — E559 Vitamin D deficiency, unspecified: Secondary | ICD-10-CM

## 2024-03-11 ENCOUNTER — Other Ambulatory Visit: Payer: Self-pay

## 2024-03-17 ENCOUNTER — Other Ambulatory Visit: Payer: Self-pay

## 2024-03-17 ENCOUNTER — Other Ambulatory Visit: Payer: Self-pay | Admitting: Nurse Practitioner

## 2024-03-17 DIAGNOSIS — I10 Essential (primary) hypertension: Secondary | ICD-10-CM

## 2024-03-18 ENCOUNTER — Other Ambulatory Visit: Payer: Self-pay

## 2024-03-19 ENCOUNTER — Other Ambulatory Visit: Payer: Self-pay

## 2024-03-19 MED FILL — Lisinopril & Hydrochlorothiazide Tab 20-12.5 MG: ORAL | 90 days supply | Qty: 180 | Fill #0 | Status: AC

## 2024-03-28 ENCOUNTER — Other Ambulatory Visit: Payer: Self-pay

## 2024-03-28 DIAGNOSIS — H0259 Other disorders affecting eyelid function: Secondary | ICD-10-CM | POA: Diagnosis not present

## 2024-03-28 DIAGNOSIS — S0502XA Injury of conjunctiva and corneal abrasion without foreign body, left eye, initial encounter: Secondary | ICD-10-CM | POA: Diagnosis not present

## 2024-03-28 MED ORDER — NEOMYCIN-POLYMYXIN-DEXAMETH 0.1 % OP SUSP
1.0000 [drp] | Freq: Three times a day (TID) | OPHTHALMIC | 0 refills | Status: AC
  Filled 2024-03-28: qty 5, 33d supply, fill #0

## 2024-04-02 ENCOUNTER — Ambulatory Visit: Admitting: Nurse Practitioner

## 2024-04-02 ENCOUNTER — Ambulatory Visit: Admitting: Urology

## 2024-04-02 ENCOUNTER — Encounter: Payer: Self-pay | Admitting: Nurse Practitioner

## 2024-04-02 ENCOUNTER — Encounter: Payer: Self-pay | Admitting: Urology

## 2024-04-02 VITALS — BP 127/79 | HR 88 | Ht 68.0 in | Wt 230.0 lb

## 2024-04-02 VITALS — BP 100/62 | HR 81 | Temp 97.7°F | Ht 68.0 in | Wt 232.6 lb

## 2024-04-02 DIAGNOSIS — N5201 Erectile dysfunction due to arterial insufficiency: Secondary | ICD-10-CM | POA: Diagnosis not present

## 2024-04-02 DIAGNOSIS — R7303 Prediabetes: Secondary | ICD-10-CM | POA: Diagnosis not present

## 2024-04-02 DIAGNOSIS — I10 Essential (primary) hypertension: Secondary | ICD-10-CM

## 2024-04-02 DIAGNOSIS — F3341 Major depressive disorder, recurrent, in partial remission: Secondary | ICD-10-CM

## 2024-04-02 DIAGNOSIS — E291 Testicular hypofunction: Secondary | ICD-10-CM | POA: Diagnosis not present

## 2024-04-02 DIAGNOSIS — R7989 Other specified abnormal findings of blood chemistry: Secondary | ICD-10-CM | POA: Diagnosis not present

## 2024-04-02 NOTE — Progress Notes (Signed)
 Stuart Glance, NP-C Phone: [phone]  Stuart Bruce is a 63 y.o. male who presents today for follow up.   Discussed the use of AI scribe software for clinical note transcription with the patient, who gave verbal consent to proceed.  History of Present Illness   Stuart Bruce is a 63 year old male who presents for a four-week follow-up visit.  He recently consulted with urology for management of testosterone levels and erectile dysfunction. Urology is overseeing these issues, including repeating blood work for testosterone levels and adjusting sildenafil  dosage.  He has been tapering off Lexapro , reducing the dose from 10 mg to 5 mg for a couple of weeks before discontinuing it completely. He feels 'perfectly fine' with no significant mood disturbances, although he experienced 'a little bit of edge' and irritation about two weeks into the weaning process, which has since resolved.  He is currently on lisinopril  and hydrochlorothiazide  for hypertension. No chest pain, shortness of breath, or dizziness.  His A1c has increased slightly to 6.6, indicating prediabetes. He is focusing on improving diet and exercise, noting efforts to increase physical activity by walking more and attempting to reduce carbohydrate intake while increasing protein consumption. He mentions challenges in dietary changes due to household preferences but is working towards a balanced approach.      Social History   Tobacco Use  Smoking Status Never   Passive exposure: Never  Smokeless Tobacco Never  Tobacco Comments   Never smoked    Current Outpatient Medications on File Prior to Visit  Medication Sig Dispense Refill   Ascorbic Acid (VITAMIN C) 1000 MG tablet Take 1,000 mg by mouth daily.     ASPIRIN LOW DOSE 81 MG chewable tablet Chew 81 mg by mouth daily.     B Complex Vitamins (B COMPLEX 100 PO) Take 1 capsule by mouth daily.     diclofenac  (VOLTAREN ) 75 MG EC tablet Take 1 tablet by mouth twice daily  with food. 30 tablet 0   lisinopril -hydrochlorothiazide  (ZESTORETIC ) 20-12.5 MG tablet Take 2 tablets by mouth daily. 180 tablet 3   neomycin-polymyxin-dexamethasone (MAXITROL) 0.1 % ophthalmic suspension Place 1 drop into the left eye 3 (three) times daily. 5 mL 0   rosuvastatin  (CRESTOR ) 20 MG tablet Take 1 tablet (20 mg total) by mouth daily. 90 tablet 1   sildenafil  (REVATIO ) 20 MG tablet Take 2-3 tablets (40-60 mg total) by mouth daily as needed (ED). 20 tablet 0   Current Facility-Administered Medications on File Prior to Visit  Medication Dose Route Frequency Provider Last Rate Last Admin   0.9 %  sodium chloride  infusion  500 mL Intravenous Once Pyrtle, Gordy HERO, MD         ROS see history of present illness  Objective  Physical Exam Vitals:   04/02/24 1058  BP: 100/62  Pulse: 81  Temp: 97.7 F (36.5 C)  SpO2: 96%    BP Readings from Last 3 Encounters:  04/02/24 100/62  04/02/24 127/79  03/04/24 118/80   Wt Readings from Last 3 Encounters:  04/02/24 232 lb 9.6 oz (105.5 kg)  04/02/24 230 lb (104.3 kg)  03/04/24 233 lb 6.4 oz (105.9 kg)    Physical Exam Constitutional:      General: He is not in acute distress.    Appearance: Normal appearance.  HENT:     Head: Normocephalic.  Cardiovascular:     Rate and Rhythm: Normal rate and regular rhythm.     Heart sounds: Normal heart sounds.  Pulmonary:     Effort: Pulmonary effort is normal.     Breath sounds: Normal breath sounds.  Skin:    General: Skin is warm and dry.  Neurological:     General: No focal deficit present.     Mental Status: He is alert.  Psychiatric:        Mood and Affect: Mood normal.        Behavior: Behavior normal.      Assessment/Plan: Please see individual problem list.  Recurrent major depressive disorder, in partial remission Assessment & Plan: Depression in remission, no significant mood disturbances. Monitor mood, consider reinitiating Lexapro  if symptoms recur. Encouraged to  contact if worsening symptoms, unusual behavior changes or suicidal thoughts occur.    Prediabetes Assessment & Plan: A1c increased to 6.6, indicating progression. Emphasized lifestyle modifications to prevent diabetes. Encouraged diet and exercise changes, reduce carbohydrates, increase protein. Recheck A1c at next visit to monitor progression.   Erectile dysfunction due to arterial insufficiency Assessment & Plan: Now managed by Urology, evaluated today. Continue current medication regimen. Follow up as scheduled.    Benign essential HTN Assessment & Plan: Essential hypertension is well-controlled with lisinopril  and hydrochlorothiazide . Continue current antihypertensive regimen.        Return in about 6 months (around 10/01/2024) for Follow up.   Stuart Glance, NP-C [facility]

## 2024-04-02 NOTE — Patient Instructions (Signed)
 [email or web address] [employer] ,com

## 2024-04-02 NOTE — Assessment & Plan Note (Signed)
 A1c increased to 6.6, indicating progression. Emphasized lifestyle modifications to prevent diabetes. Encouraged diet and exercise changes, reduce carbohydrates, increase protein. Recheck A1c at next visit to monitor progression.

## 2024-04-02 NOTE — Assessment & Plan Note (Signed)
 Depression in remission, no significant mood disturbances. Monitor mood, consider reinitiating Lexapro  if symptoms recur. Encouraged to contact if worsening symptoms, unusual behavior changes or suicidal thoughts occur.

## 2024-04-02 NOTE — Assessment & Plan Note (Signed)
 Essential hypertension is well-controlled with lisinopril  and hydrochlorothiazide . Continue current antihypertensive regimen.

## 2024-04-02 NOTE — Progress Notes (Signed)
 04/02/2024 8:11 AM   Lani JONETTA Mania July 09, 1960 [id number]  Referring provider: Gretel App, NP [address]  [place] [address]  Chief Complaint  Patient presents with   Erectile Dysfunction    HPI: Stuart Bruce is a 63 y.o. male referred for evaluation of erectile dysfunction and hypogonadism.  3-4 year history of difficulty achieving and maintaining an erection.  He has partial erections which are firm enough for penetration much less than 50% of the time.  The times he is able to achieve penetration he is able to maintain much less than 50% of time.  SHIM today was 6/25 indicating severe ED No pain or curvature with erections Trial of sildenafil  60 mg was not effective He does complain of significant decreased libido.  Recently taken off Lexapro  and his libido has improved slightly. Organic risk factors include hyperlipidemia, hypertension and antihypertensive medication No prior history of tobacco use + sleep apnea on CPAP Testosterone level 03/04/2024 was 216 ng/dL EtOH intake at 6 drinks per week Last PSA January 2024 was stable at 1.32   PMH: Past Medical History:  Diagnosis Date   Depression    Encounter for general adult medical examination with abnormal findings 01/10/2023   Hypertension    OSA (obstructive sleep apnea)    no CPAP use   Sleep apnea    Substance abuse (HCC)    etoh    Surgical History: Past Surgical History:  Procedure Laterality Date   COLONOSCOPY     HAND SURGERY Right 06/06/2003   Cyst on ring finger   JOINT REPLACEMENT  07/2018   Rotator Cuff Repair   POLYPECTOMY     ROTATOR CUFF REPAIR Right     Home Medications:  Allergies as of 04/02/2024   No Known Allergies      Medication List        Accurate as of April 02, 2024  8:11 AM. If you have any questions, ask your nurse or doctor.          STOP taking these medications    escitalopram  10 MG tablet Commonly known as: LEXAPRO  Stopped by: Glendia JAYSON Barba   Vitamin D  (Ergocalciferol ) 1.25 MG (50000 UNIT) Caps capsule Commonly known as: DRISDOL  Stopped by: Glendia JAYSON Barba       TAKE these medications    Aspirin Low Dose 81 MG chewable tablet Generic drug: aspirin Chew 81 mg by mouth daily.   B COMPLEX 100 PO Take 1 capsule by mouth daily.   diclofenac  75 MG EC tablet Commonly known as: VOLTAREN  Take 1 tablet by mouth twice daily with food.   lisinopril -hydrochlorothiazide  20-12.5 MG tablet Commonly known as: ZESTORETIC  Take 2 tablets by mouth daily.   neomycin-polymyxin b-dexamethasone 3.5-10000-0.1 Susp Commonly known as: Maxitrol Place 1 drop into the left eye 3 (three) times daily.   rosuvastatin  20 MG tablet Commonly known as: CRESTOR  Take 1 tablet (20 mg total) by mouth daily.   sildenafil  20 MG tablet Commonly known as: REVATIO  Take 2-3 tablets (40-60 mg total) by mouth daily as needed (ED).   vitamin C 1000 MG tablet Take 1,000 mg by mouth daily.        Allergies: No Known Allergies  Family History: Family History  Problem Relation Age of Onset   Heart disease Mother        Valve replacement   Cancer Mother        Breast   Hypertension Mother    Diabetes Father  Cancer Father        Renal   Drug abuse Paternal Aunt    Colon cancer Neg Hx    Colon polyps Neg Hx    Crohn's disease Neg Hx    Esophageal cancer Neg Hx    Rectal cancer Neg Hx    Stomach cancer Neg Hx    Ulcerative colitis Neg Hx     Social History:  reports that he has never smoked. He has never been exposed to tobacco smoke. He has never used smokeless tobacco. He reports that he does not currently use alcohol  after a past usage of about 6.0 standard drinks of alcohol  per week. He reports that he does not use drugs.   Physical Exam: BP 127/79   Pulse 88   Ht 5' 8 (1.727 m)   Wt 230 lb (104.3 kg)   BMI 34.97 kg/m   Constitutional:  Alert, No acute distress. HEENT: [place] AT Respiratory: Normal respiratory effort,  no increased work of breathing. GU: Phallus circumcised without lesions/plaques.  Testes descended bilaterally without masses or tenderness.  Estimated volume 15 cc bilaterally Psychiatric: Normal mood and affect.   Assessment & Plan:    1.  Erectile dysfunction We discussed vascular disease is the cause of 70% of ED Unlikely improving his testosterone level resolve his ED however may improve the efficacy of the PDE 5 inhibitor Have initially recommend he titrate his sildenafil  to 100 mg  2.  Hypogonadism We discussed the diagnosis of testosterone is based on 2 abnormal total testosterone levels drawn in the a.m. admitted with signs and symptoms of low testosterone. We discussed various forms of testosterone placement including topical preparations, intramuscular injections, subcutaneous injections, subcutaneous pellet implantation and oral testosterone.  Pros and cons of each form were discussed.  The risk of transference of topical testosterone. I had an extensive discussion regarding testosterone replacement therapy including the following: Treatment may result in improvements in erectile function, low sex drive, anemia, bone mineral density, lean body mass, and depressive symptoms; evidence is inconclusive whether testosterone therapy improves cognitive function, measures of diabetes, energy, fatigue, lipid profiles, and quality of life measures; there is no conclusive evidence linking testosterone therapy to the development of prostate cancer; there is no definitive evidence linking testosterone therapy to a higher incidence of venothrombolic events; at the present time it cannot be stated definitively whether testosterone therapy increases or decreases the risk of cardiovascular events including myocardial infarction and stroke. Potential side effects were discussed including erythrocytosis, gynecomastia.  The need for regular monitoring of testosterone levels and hematocrit was  discussed. Repeat testosterone level and LH were drawn this morning He will think over his replacement options    Glendia JAYSON Barba, MD  [facility] [address] [place], [place] [address] [phone]

## 2024-04-02 NOTE — Assessment & Plan Note (Signed)
 Now managed by Urology, evaluated today. Continue current medication regimen. Follow up as scheduled.

## 2024-04-03 ENCOUNTER — Ambulatory Visit: Payer: Self-pay | Admitting: Urology

## 2024-04-03 ENCOUNTER — Ambulatory Visit: Admitting: Nurse Practitioner

## 2024-04-03 LAB — TESTOSTERONE: Testosterone: 283 ng/dL (ref 264–916)

## 2024-04-03 LAB — LUTEINIZING HORMONE: LH: 5.8 m[IU]/mL (ref 1.7–8.6)

## 2024-04-05 LAB — SPECIMEN STATUS REPORT

## 2024-04-05 LAB — TESTOSTERONE,FREE AND TOTAL
Testosterone, Free: 7.3 pg/mL (ref 6.6–18.1)
Testosterone: 283 ng/dL (ref 264–916)

## 2024-04-08 ENCOUNTER — Telehealth: Payer: Self-pay

## 2024-04-08 ENCOUNTER — Other Ambulatory Visit: Payer: Self-pay | Admitting: Nurse Practitioner

## 2024-04-08 DIAGNOSIS — R7989 Other specified abnormal findings of blood chemistry: Secondary | ICD-10-CM

## 2024-04-08 NOTE — Telephone Encounter (Signed)
Pt informed

## 2024-04-08 NOTE — Telephone Encounter (Signed)
 Copied from CRM [id number]. Topic: Referral - Status >> Apr 08, 2024 10:08 AM Charolett L wrote: Reason for CRM: patient is requesting for a new referral for new urologist due to issues with doctor and he refuses to go back

## 2024-04-10 ENCOUNTER — Encounter: Payer: Self-pay | Admitting: Nurse Practitioner

## 2024-04-22 ENCOUNTER — Other Ambulatory Visit: Payer: Self-pay

## 2024-04-22 ENCOUNTER — Other Ambulatory Visit: Payer: Self-pay | Admitting: Urology

## 2024-04-22 MED ORDER — TESTOSTERONE CYPIONATE 200 MG/ML IM SOLN
200.0000 mg | INTRAMUSCULAR | 0 refills | Status: DC
  Filled 2024-04-22 (×2): qty 4, 56d supply, fill #0

## 2024-04-24 DIAGNOSIS — G4733 Obstructive sleep apnea (adult) (pediatric): Secondary | ICD-10-CM | POA: Diagnosis not present

## 2024-04-25 ENCOUNTER — Other Ambulatory Visit: Payer: Self-pay | Admitting: Nurse Practitioner

## 2024-04-25 ENCOUNTER — Other Ambulatory Visit: Payer: Self-pay

## 2024-04-25 DIAGNOSIS — H0259 Other disorders affecting eyelid function: Secondary | ICD-10-CM | POA: Diagnosis not present

## 2024-04-25 MED ORDER — ROSUVASTATIN CALCIUM 20 MG PO TABS
20.0000 mg | ORAL_TABLET | Freq: Every day | ORAL | 3 refills | Status: AC
  Filled 2024-04-25 – 2024-05-20 (×2): qty 90, 90d supply, fill #0

## 2024-04-28 ENCOUNTER — Other Ambulatory Visit: Payer: Self-pay | Admitting: Urology

## 2024-04-28 ENCOUNTER — Other Ambulatory Visit: Payer: Self-pay

## 2024-04-28 MED ORDER — TESTOSTERONE CYPIONATE 200 MG/ML IM SOLN: 200.0000 mg | INTRAMUSCULAR | 0 refills | Status: AC

## 2024-04-29 ENCOUNTER — Other Ambulatory Visit: Payer: Self-pay

## 2024-04-30 ENCOUNTER — Other Ambulatory Visit: Payer: Self-pay

## 2024-04-30 DIAGNOSIS — E291 Testicular hypofunction: Secondary | ICD-10-CM

## 2024-05-05 ENCOUNTER — Other Ambulatory Visit: Payer: Self-pay

## 2024-05-09 ENCOUNTER — Ambulatory Visit

## 2024-05-09 ENCOUNTER — Other Ambulatory Visit

## 2024-05-16 DIAGNOSIS — G4733 Obstructive sleep apnea (adult) (pediatric): Secondary | ICD-10-CM | POA: Diagnosis not present

## 2024-05-21 ENCOUNTER — Ambulatory Visit

## 2024-05-21 ENCOUNTER — Other Ambulatory Visit: Payer: Self-pay

## 2024-05-21 ENCOUNTER — Other Ambulatory Visit

## 2024-05-21 DIAGNOSIS — E291 Testicular hypofunction: Secondary | ICD-10-CM

## 2024-05-21 MED ORDER — TESTOSTERONE CYPIONATE 200 MG/ML IM SOLN
200.0000 mg | Freq: Once | INTRAMUSCULAR | Status: AC
  Administered 2024-05-21: 09:00:00 200 mg via INTRAMUSCULAR

## 2024-05-21 NOTE — Patient Instructions (Signed)
 Supplies needed: -Testosterone  from your pharmacy -Alcohol swabs -3cc Luer Lock syringes -18G Luer Lock needles to draw up the medicine -21G Luer Lock needles to inject the medicine -Bandaids or gauze pads -Optional: Transport planner      Instructions for disposing of sharps:  Disposal of syringes and other sharp objects is monitored by the Dietitian (EPA). It is important to dispose of them properly for your safety and for the safety of others.  The EPA promotes all recycling activities, and therefore encourages you to discard medical waste sharps in sturdy, non-recyclable containers, when possible.  Your stat or community environmental programs may have other requirements or suggestions for disposing of your medical waste.  You should contact your local EPA office for any information you may need.  What container should be used Place needles, syringes, lancets and other sharp objects in a hard plastic or metal container with a screw on or tightly secured lid.  Many containers found in the household will do, or you may purchase containers specifically designed for disposal of medical wast sharps.  If a recyclable container is used to dispose of medical waste sharps, make sure that you don't mix the container with other materials to be recycled.  Since the sharps impair a containers recyclability, a container holding your medical waste sharps properly belongs with the regular household trash.  You should label the container "Not for Recycling".  In addition, make sure your sharps container is made of non breakable material and has a lid that can be securely closed (screwed on or tightly secured).  Before discarding a container, be sure to reinforce the lid with heavy-duty tape.  Do not put sharpe objects in a container you plan to recycle or return to a store, and do not use glass or clear plastic containers (see additional information below).  Finally, make sure that you  keep all containers with sharp objects out of the reach of children and pets.  Your home care provider may deliver a sharps container with your medical supplies.  If so place all needles, syringes and lancets in this container and notify the company when the container is approximately 75% full.  Your home care provider will arrange for pickup of the container.  For your safety, do NOT bring your container to the hospital for disposal.        Tips for minimizing injection pain- -inject medicine that is at room temperature -remove all air bubbles from the syringe before injection -wait until the topical alcohol has evaporated before injecting -keep muscles in the injection area relaxed -break through the skin quickly -don't change the direction of the needle as it goes in or comes out -do not reuse disposable needles

## 2024-05-21 NOTE — Progress Notes (Signed)
 Patient presents today for Testosterone  injection teaching. Patient was instructed on how to properly use the 18guage needle to draw up 1cc of the testosterone , into 3cc syringe then changed the needle to the 21 guage for injection. Patient then cleaned the vastus lateralis with an alcohol  swab and injected the site with bevel up.   Patient dose: 200 mg Lot Number: [id number] Expiration date:08/02/2025 Location: Right upper thigh  Patient verbalized understanding current dose is 1ml every 14days unless instructed by a provider.  Patient tolerated well. Mid cycle lab scheduled for 07/09/24  Patient understood how to dispose of sharps properly and store medication.   Performed by: Mathew Pinal, RN

## 2024-05-22 ENCOUNTER — Ambulatory Visit: Payer: Self-pay | Admitting: Urology

## 2024-05-22 LAB — TESTOSTERONE: Testosterone: 350 ng/dL (ref 264–916)

## 2024-06-27 ENCOUNTER — Other Ambulatory Visit: Payer: Self-pay | Admitting: Ophthalmology

## 2024-06-27 DIAGNOSIS — G453 Amaurosis fugax: Secondary | ICD-10-CM

## 2024-07-02 MED FILL — Lisinopril & Hydrochlorothiazide Tab 20-12.5 MG: ORAL | 90 days supply | Qty: 180 | Fill #1 | Status: AC

## 2024-07-03 ENCOUNTER — Ambulatory Visit
Admission: RE | Admit: 2024-07-03 | Discharge: 2024-07-03 | Disposition: A | Discharge status: Home / Self Care | Source: Ambulatory Visit | Attending: Ophthalmology | Admitting: Ophthalmology

## 2024-07-03 DIAGNOSIS — G453 Amaurosis fugax: Secondary | ICD-10-CM

## 2024-07-04 ENCOUNTER — Other Ambulatory Visit: Payer: Self-pay | Admitting: Ophthalmology

## 2024-07-04 DIAGNOSIS — I7771 Dissection of carotid artery: Secondary | ICD-10-CM

## 2024-07-04 DIAGNOSIS — G453 Amaurosis fugax: Secondary | ICD-10-CM

## 2024-07-09 ENCOUNTER — Other Ambulatory Visit

## 2024-07-09 DIAGNOSIS — E291 Testicular hypofunction: Secondary | ICD-10-CM

## 2024-07-10 LAB — TESTOSTERONE: Testosterone: 1041 ng/dL — ABNORMAL HIGH (ref 264–916)

## 2024-07-11 ENCOUNTER — Inpatient Hospital Stay: Admission: RE | Admit: 2024-07-11

## 2024-07-11 ENCOUNTER — Encounter: Payer: Self-pay | Admitting: Radiology

## 2024-07-11 DIAGNOSIS — G453 Amaurosis fugax: Secondary | ICD-10-CM

## 2024-07-11 DIAGNOSIS — I7771 Dissection of carotid artery: Secondary | ICD-10-CM

## 2024-07-11 MED ORDER — IOPAMIDOL (ISOVUE-370) INJECTION 76%
75.0000 mL | Freq: Once | INTRAVENOUS | Status: AC | PRN
  Administered 2024-07-11: 75 mL via INTRAVENOUS

## 2024-07-16 ENCOUNTER — Other Ambulatory Visit

## 2024-10-01 ENCOUNTER — Ambulatory Visit: Admitting: Nurse Practitioner
